# Patient Record
Sex: Female | Born: 1985 | Race: White | Hispanic: No | Smoking: Current every day smoker
Health system: Southern US, Community
[De-identification: ages and names within clinical notes are randomized; demographics above are authoritative.]

## PROBLEM LIST (undated history)

## (undated) DIAGNOSIS — C539 Malignant neoplasm of cervix uteri, unspecified: Secondary | ICD-10-CM

## (undated) DIAGNOSIS — R519 Headache, unspecified: Secondary | ICD-10-CM

## (undated) DIAGNOSIS — N390 Urinary tract infection, site not specified: Secondary | ICD-10-CM

## (undated) DIAGNOSIS — R51 Headache: Secondary | ICD-10-CM

## (undated) DIAGNOSIS — F419 Anxiety disorder, unspecified: Secondary | ICD-10-CM

## (undated) DIAGNOSIS — J40 Bronchitis, not specified as acute or chronic: Secondary | ICD-10-CM

## (undated) DIAGNOSIS — N200 Calculus of kidney: Secondary | ICD-10-CM

## (undated) DIAGNOSIS — N83209 Unspecified ovarian cyst, unspecified side: Secondary | ICD-10-CM

## (undated) DIAGNOSIS — K219 Gastro-esophageal reflux disease without esophagitis: Secondary | ICD-10-CM

## (undated) DIAGNOSIS — F329 Major depressive disorder, single episode, unspecified: Secondary | ICD-10-CM

## (undated) DIAGNOSIS — F32A Depression, unspecified: Secondary | ICD-10-CM

## (undated) HISTORY — PX: APPENDECTOMY: SHX54

## (undated) HISTORY — PX: CRYOTHERAPY: SHX1416

## (undated) HISTORY — PX: WISDOM TOOTH EXTRACTION: SHX21

## (undated) HISTORY — PX: DIAGNOSTIC LAPAROSCOPY: SUR761

---
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
## 2004-03-12 ENCOUNTER — Emergency Department (HOSPITAL_COMMUNITY): Admission: EM | Admit: 2004-03-12 | Discharge: 2004-03-12 | Payer: Self-pay | Admitting: Emergency Medicine

## 2005-08-27 ENCOUNTER — Emergency Department (HOSPITAL_COMMUNITY): Admission: EM | Admit: 2005-08-27 | Discharge: 2005-08-27 | Payer: Self-pay | Admitting: *Deleted

## 2007-05-20 ENCOUNTER — Emergency Department (HOSPITAL_COMMUNITY): Admission: EM | Admit: 2007-05-20 | Discharge: 2007-05-20 | Payer: Self-pay | Admitting: Emergency Medicine

## 2009-11-13 ENCOUNTER — Emergency Department (HOSPITAL_COMMUNITY): Admission: EM | Admit: 2009-11-13 | Discharge: 2009-11-13 | Payer: Self-pay | Admitting: Family Medicine

## 2009-12-04 ENCOUNTER — Emergency Department (HOSPITAL_COMMUNITY): Admission: EM | Admit: 2009-12-04 | Discharge: 2009-12-04 | Payer: Self-pay | Admitting: Emergency Medicine

## 2009-12-07 ENCOUNTER — Emergency Department (HOSPITAL_COMMUNITY): Admission: EM | Admit: 2009-12-07 | Discharge: 2009-12-07 | Payer: Self-pay | Admitting: Family Medicine

## 2010-05-31 LAB — POCT URINALYSIS DIPSTICK
Bilirubin Urine: NEGATIVE
Glucose, UA: NEGATIVE mg/dL
Ketones, ur: NEGATIVE mg/dL
Nitrite: NEGATIVE
Protein, ur: NEGATIVE mg/dL
Specific Gravity, Urine: 1.015 (ref 1.005–1.030)
Urobilinogen, UA: 1 mg/dL (ref 0.0–1.0)
pH: 7 (ref 5.0–8.0)

## 2010-05-31 LAB — POCT PREGNANCY, URINE: Preg Test, Ur: NEGATIVE

## 2010-06-11 ENCOUNTER — Emergency Department (HOSPITAL_COMMUNITY)
Admission: EM | Admit: 2010-06-11 | Discharge: 2010-06-11 | Disposition: A | Discharge status: Home / Self Care | Payer: Self-pay | Attending: Emergency Medicine | Admitting: Emergency Medicine

## 2010-06-11 DIAGNOSIS — M545 Low back pain, unspecified: Secondary | ICD-10-CM | POA: Insufficient documentation

## 2010-06-11 DIAGNOSIS — G8929 Other chronic pain: Secondary | ICD-10-CM | POA: Insufficient documentation

## 2010-08-03 NOTE — Op Note (Signed)
[facility]  Patient:    Ashley Francis                         MRN: [id number] Proc. Date: 03/01/99 Adm. Date:  [id number] Attending:  Pleas Koch CC:         [facility], Attn. Daylene Posey, FNP                           Operative Report  PREOPERATIVE DIAGNOSIS:       Right adnexal mass, intermittent pelvic pain.  POSTOPERATIVE DIAGNOSIS:      Right hydrosalpinx, absent right ovary.  OPERATION:                    Open laparoscopy with right partial salpingectomy and lysis of adhesions.  SURGEON:                      Georgina Peer, M.D.  ASSISTANT:                    Henreitta Leber, P.A.  ANESTHESIA:                   General anesthesia by Gretta Cool., M.D.  ESTIMATED BLOOD LOSS:         Less than 25 cc.  COMPLICATIONS:                None.  INDICATIONS:                  Right cystic adnexal mass on frozen section was a  right hydrosalpinx, absent right ovary.  Adhesions of the posterior uterus to the cul-de-sac.  Adhesions of the left tube to the descending colon.  A 25 year old, gravida 0, with history of ruptured appendix at age 40, with a cystic adnexal mass which was symptomatic and stable.  The patient was brought in for exploration and removal of this mass.  DESCRIPTION OF PROCEDURE:     The patient was taken to the operating room and given a general anesthesia, placed in the dorsal lithotomy position.  Abdomen, perineum, vagina, and urethra were prepped and draped sterilely.  A Foley catheter was placed in her bladder.  Bimanual examination revealed an anterior small mobile uterus ith a fullness in the right adnexa.  After adequate anesthesia, a vertical subumbilical incision was made.  The fascia was identified, transected, and 0 Vicryl sutures  were placed.  The peritoneum was identified and opened.  An open laparoscopic port was placed and a pneumoperitoneum established.  The laparoscope was  placed and he following pelvic findings were noted.  There appeared to be no injury from laparoscopic trocar placement nor any anterior abdominal wall adhesions.  The uterus was normal.  There was a cystic mass in the right adnexa between the uterus and the right pelvic side wall.  There was no clearly identifiable tube or ovary only this mass which had adhesions of the bowel to it.  The left ovary was freely mobile.  The left tube appeared normal, however, was adherent to the descending  colon.  The tube was posterior to the ovary and intringed in the colon.  While art of the fimbriated end was noted, it was uncertain whether the fimbriated end was free or healthy.  Using Harmonic scalpel and dissection, the cystic mass was dissected free and  removed.  There was good hemostasis and no injury to the colon or the pelvic side wall.  The ureter on the right side was clearly identified, separate and distant from this mass.  The mass was sent to pathology and frozen  section interpretation by Marcie Bal, M.D. was a hydrosalpinx.  Adhesions  were lysed between the uterus and the cul-de-sac.  There were no upper abdominal adhesions.  Photodocumentation was made and the ports removed.  10 cc of 0.25% Marcaine was drizzled over the operative site over where the tube was removed.  Also 10 cc of 0.25% Marcaine plain was injected into the laparoscopic port sites. The fascia was reapproximated with the Vicryl suture.  The skin was closed with  subcutaneous and sucuticular sutures of Dexon.  The vaginal instrument was removed. Sponge, needle, and instrument counts were correct.  The Foley catheter was removed and the patient was transferred to the recovery room in good condition. Sponge, needle, and instrument counts were correct. DD:  03/01/99 TD:  03/01/99 Job: [id number]

## 2010-12-10 LAB — URINALYSIS, ROUTINE W REFLEX MICROSCOPIC
Bilirubin Urine: NEGATIVE
Hgb urine dipstick: NEGATIVE
Protein, ur: NEGATIVE
Urobilinogen, UA: 0.2

## 2012-01-15 ENCOUNTER — Emergency Department (HOSPITAL_COMMUNITY)
Admission: EM | Admit: 2012-01-15 | Discharge: 2012-01-15 | Disposition: A | Discharge status: Home / Self Care | Payer: Medicaid Other | Source: Home / Self Care

## 2012-01-15 ENCOUNTER — Encounter (HOSPITAL_COMMUNITY): Payer: Self-pay | Admitting: *Deleted

## 2012-01-15 DIAGNOSIS — J329 Chronic sinusitis, unspecified: Secondary | ICD-10-CM

## 2012-01-15 MED ORDER — IBUPROFEN 800 MG PO TABS: 800.0000 mg | ORAL_TABLET | Freq: Three times a day (TID) | ORAL | Status: DC

## 2012-01-15 MED ORDER — AMOXICILLIN 500 MG PO CAPS: 500.0000 mg | ORAL_CAPSULE | Freq: Three times a day (TID) | ORAL | Status: DC

## 2012-01-15 NOTE — ED Provider Notes (Signed)
History     CSN: [id number]  Arrival date & time 01/15/12  1847   None     No chief complaint on file.   (Consider location/radiation/quality/duration/timing/severity/associated sxs/prior treatment) Patient is a 26 y.o. female presenting with headaches. The history is provided by the patient. No language interpreter was used.  Headache The primary symptoms include headaches. The symptoms began yesterday. The symptoms are worsening. Context: sinus congestion.  Pt complains of pressure in face.   Pt complains of a right earache.  Pt has a history of sinus infections.  Pt reports this feels the same  No past medical history on file.  No past surgical history on file.  No family history on file.  History  Substance Use Topics  . Smoking status: Not on file  . Smokeless tobacco: Not on file  . Alcohol Use: Not on file    OB History    No data available      Review of Systems  HENT: Positive for ear pain, congestion and sore throat.   Neurological: Positive for headaches.  All other systems reviewed and are negative.    Allergies  Review of patient's allergies indicates not on file.  Home Medications  No current outpatient prescriptions on file.  There were no vitals taken for this visit.  Physical Exam  Nursing note and vitals reviewed. Constitutional: She appears well-developed and well-nourished.  HENT:  Head: Normocephalic and atraumatic.  Right Ear: External ear normal.  Left Ear: External ear normal.  Mouth/Throat: Oropharynx is clear and moist.  Eyes: Conjunctivae normal and EOM are normal. Pupils are equal, round, and reactive to light.  Neck: Normal range of motion.  Cardiovascular: Normal rate and normal heart sounds.   Pulmonary/Chest: Effort normal.  Abdominal: Soft.  Musculoskeletal: Normal range of motion.  Neurological: She is alert.  Skin: Skin is warm.  Psychiatric: She has a normal mood and affect.    ED Course  Procedures (including  critical care time)  Labs Reviewed - No data to display No results found.   1. Sinusitis       MDM  amoxicillian and ibuprofen         Lonia Skinner [place], [place] 01/15/12 2044

## 2012-01-15 NOTE — ED Notes (Signed)
C/o feeling bad yesterday, sinus pain and pressure, headache and R earache. No chills but had sweating last night and this AM.  No sinus drainage or cough.

## 2012-01-16 NOTE — ED Provider Notes (Signed)
Medical screening examination/treatment/procedure(s) were performed by resident physician or non-physician practitioner and as supervising physician I was immediately available for consultation/collaboration.   Gweneth Fredlund DOUGLAS MD.    Mercy Malena D Vega Stare, MD 01/16/12 1857 

## 2012-04-09 ENCOUNTER — Emergency Department (HOSPITAL_COMMUNITY)
Admission: EM | Admit: 2012-04-09 | Discharge: 2012-04-09 | Disposition: A | Discharge status: Home / Self Care | Payer: Medicaid Other | Attending: Emergency Medicine | Admitting: Emergency Medicine

## 2012-04-09 ENCOUNTER — Emergency Department (HOSPITAL_COMMUNITY): Payer: Medicaid Other

## 2012-04-09 ENCOUNTER — Encounter (HOSPITAL_COMMUNITY): Payer: Self-pay | Admitting: *Deleted

## 2012-04-09 DIAGNOSIS — F17200 Nicotine dependence, unspecified, uncomplicated: Secondary | ICD-10-CM | POA: Insufficient documentation

## 2012-04-09 DIAGNOSIS — R071 Chest pain on breathing: Secondary | ICD-10-CM | POA: Insufficient documentation

## 2012-04-09 DIAGNOSIS — R0789 Other chest pain: Secondary | ICD-10-CM

## 2012-04-09 DIAGNOSIS — Z79899 Other long term (current) drug therapy: Secondary | ICD-10-CM | POA: Insufficient documentation

## 2012-04-09 LAB — BASIC METABOLIC PANEL
BUN: 8 mg/dL (ref 6–23)
CO2: 25 mEq/L (ref 19–32)
Calcium: 9.1 mg/dL (ref 8.4–10.5)
Creatinine, Ser: 0.73 mg/dL (ref 0.50–1.10)

## 2012-04-09 LAB — TROPONIN I: Troponin I: <0.3 ng/mL (ref <0.30)

## 2012-04-09 LAB — CBC
HCT: 40.6 % (ref 36.0–46.0)
MCH: 31.1 pg (ref 26.0–34.0)
MCV: 87.1 fL (ref 78.0–100.0)
Platelets: 305 10*3/uL (ref 150–400)
RBC: 4.66 MIL/uL (ref 3.87–5.11)
RDW: 12.3 % (ref 11.5–15.5)

## 2012-04-09 MED ORDER — ONDANSETRON HCL 4 MG/2ML IJ SOLN
4.0000 mg | Freq: Once | INTRAMUSCULAR | Status: AC
  Administered 2012-04-09: 4 mg via INTRAVENOUS
  Filled 2012-04-09: qty 2

## 2012-04-09 MED ORDER — ASPIRIN 325 MG PO TABS
325.0000 mg | ORAL_TABLET | Freq: Once | ORAL | Status: AC
  Administered 2012-04-09: 325 mg via ORAL
  Filled 2012-04-09: qty 1

## 2012-04-09 MED ORDER — MORPHINE SULFATE 4 MG/ML IJ SOLN
4.0000 mg | Freq: Once | INTRAMUSCULAR | Status: AC
  Administered 2012-04-09: 4 mg via INTRAVENOUS
  Filled 2012-04-09: qty 1

## 2012-04-09 MED ORDER — OXYCODONE-ACETAMINOPHEN 5-325 MG PO TABS
1.0000 | ORAL_TABLET | Freq: Once | ORAL | Status: AC
  Administered 2012-04-09: 1 via ORAL
  Filled 2012-04-09: qty 1

## 2012-04-09 MED ORDER — IBUPROFEN 600 MG PO TABS: 600.0000 mg | ORAL_TABLET | Freq: Four times a day (QID) | ORAL | Status: DC | PRN

## 2012-04-09 NOTE — ED Notes (Signed)
Patient states chest pain x 1 day, patient states left sided chest pain radiating to left shoulder, patient states she took xanax type medicine last night, went to sleep and then awoke with pain this am

## 2012-04-09 NOTE — ED Provider Notes (Signed)
History     CSN: [id number]  Arrival date & time 04/09/12  0705   First MD Initiated Contact with Patient 04/09/12 [phone]      Chief Complaint  Patient presents with  . Chest Pain    (Consider location/radiation/quality/duration/timing/severity/associated sxs/prior treatment) HPI Pt presents with c/o chest pain which has been present constantly over the past 24 hours.  She states pain is located under her left breast, is sharp in nature and is causing her to have difficulty taking deep breaths as this makes the pain worse.  No cough or fever.  No leg swelling.  Pain is worse with movement and deep breathing.  She took a xanax last night which seemed to help, but then was woken up by the pain this morning.  No recent travel/surgery/trauma.  No hx of DVT/PE.  Pain is not associated with exertion.  There are no other associated systemic symptoms, there are no other alleviating or modifying factors.   History reviewed. No pertinent past medical history.  History reviewed. No pertinent past surgical history.  No family history on file.  History  Substance Use Topics  . Smoking status: Current Every Day Smoker -- 0.1 packs/day    Types: Cigarettes  . Smokeless tobacco: Not on file  . Alcohol Use: Yes     Comment: occasional    OB History    Grav Para Term Preterm Abortions TAB SAB Ect Mult Living                  Review of Systems ROS reviewed and all otherwise negative except for mentioned in HPI  Allergies  Review of patient's allergies indicates no known allergies.  Home Medications   Current Outpatient Rx  Name  Route  Sig  Dispense  Refill  . IBUPROFEN 800 MG PO TABS   Oral   Take 800 mg by mouth every 8 (eight) hours as needed. For pain         . NORETHINDRONE-ETH ESTRADIOL 1-35 MG-MCG PO TABS   Oral   Take 1 tablet by mouth daily.         . IBUPROFEN 600 MG PO TABS   Oral   Take 1 tablet (600 mg total) by mouth every 6 (six) hours as needed for pain.  30 tablet   0     BP 137/77  Temp 98.1 F (36.7 C) (Oral)  Resp 18  SpO2 100%  LMP 03/11/2012 Vitals reviewed Physical Exam Physical Examination: General appearance - alert, well appearing, and in no distress Mental status - alert, oriented to person, place, and time Eyes - no scleral icterus, no conjunctival injection Mouth - mucous membranes moist, pharynx normal without lesions Chest - clear to auscultation, no wheezes, rales or rhonchi, symmetric air entry, mild ttp over left lower chest wall under breast Heart - normal rate, regular rhythm, normal S1, S2, no murmurs, rubs, clicks or gallops Abdomen - soft, nontender, nondistended, no masses or organomegaly Extremities - peripheral pulses normal, no pedal edema, no clubbing or cyanosis Skin - normal coloration and turgor, no rashes  ED Course  Procedures (including critical care time)   Date: 04/09/2012  Rate: 80  Rhythm: normal sinus rhythm  QRS Axis: normal  Intervals: normal  ST/T Wave abnormalities: normal  Conduction Disutrbances: none  Narrative Interpretation: unremarkable, no prior ekg      Labs Reviewed  BASIC METABOLIC PANEL - Abnormal; Notable for the following:    Glucose, Bld 101 (*)  All other components within normal limits  CBC  TROPONIN I  D-DIMER, QUANTITATIVE   Dg Chest 2 View  04/09/2012  *RADIOLOGY REPORT*  Clinical Data: Left chest pain  CHEST - 2 VIEW  Comparison: None  Findings: Upper-normal size of cardiac silhouette. Mediastinal contours and pulmonary vascularity normal. Lungs clear. No pleural effusion or pneumothorax. Bones unremarkable.  IMPRESSION: No acute abnormalities.   Original Report Authenticated By: Ulyses Southward, M.D.      1. Chest wall pain       MDM  Pt presenting with c/o left sided chest pain.  Low suspicion for ACS given normal EKG and negative troponin after 24 hours of constant pain.  D-dimer obtained due to hx of OCP use- this was also negative.  CXR negative  as well.  Pt's pain improved after pain meds in the ED.  Discharged with strict return precautions.  Pt agreeable with plan.        Ethelda Chick, MD 04/09/12 [phone]

## 2013-01-06 ENCOUNTER — Emergency Department (INDEPENDENT_AMBULATORY_CARE_PROVIDER_SITE_OTHER)
Admission: EM | Admit: 2013-01-06 | Discharge: 2013-01-06 | Disposition: A | Discharge status: Home / Self Care | Payer: Medicaid Other | Source: Home / Self Care

## 2013-01-06 ENCOUNTER — Encounter (HOSPITAL_COMMUNITY): Payer: Self-pay | Admitting: Emergency Medicine

## 2013-01-06 DIAGNOSIS — L2089 Other atopic dermatitis: Secondary | ICD-10-CM

## 2013-01-06 DIAGNOSIS — L209 Atopic dermatitis, unspecified: Secondary | ICD-10-CM

## 2013-01-06 MED ORDER — PREDNISONE 20 MG PO TABS: ORAL_TABLET | ORAL | Status: DC

## 2013-01-06 MED ORDER — DIPHENHYDRAMINE HCL 25 MG PO TABS: 12.5000 mg | ORAL_TABLET | Freq: Four times a day (QID) | ORAL | Status: DC | PRN

## 2013-01-06 NOTE — ED Provider Notes (Signed)
CSN: [id number]     Arrival date & time 01/06/13  1604 History   None    Chief Complaint  Patient presents with  . Rash   HPI 27 year old Caucasian female, recently had clindamycin antibiotic prophylaxis for root canal surgery, prescribed this 10/7, received at 10/9, to get to 10/12 and then developed final fascicular rash over chest back and face. Denies any shortness of breath, difficulty breathing, diarrhea, swelling of the mouth or tongue States that she has never had this type of reaction before    History reviewed. No pertinent past medical history. History reviewed. No pertinent past surgical history. History reviewed. No pertinent family history. History  Substance Use Topics  . Smoking status: Current Every Day Smoker -- 0.10 packs/day    Types: Cigarettes  . Smokeless tobacco: Not on file  . Alcohol Use: Yes     Comment: occasional   OB History   Grav Para Term Preterm Abortions TAB SAB Ect Mult Living                 Review of Systems No blurred vision or double vision no chest pain or shortness of breath No diarrhea no constipation For fever no chills No bites  Allergies  Clindamycin/lincomycin  Home Medications   Current Outpatient Rx  Name  Route  Sig  Dispense  Refill  . ibuprofen (ADVIL,MOTRIN) 600 MG tablet   Oral   Take 1 tablet (600 mg total) by mouth every 6 (six) hours as needed for pain.   30 tablet   0   . ibuprofen (ADVIL,MOTRIN) 800 MG tablet   Oral   Take 800 mg by mouth every 8 (eight) hours as needed. For pain         . norethindrone-ethinyl estradiol 1/35 (ORTHO-NOVUM, NORTREL,CYCLAFEM) tablet   Oral   Take 1 tablet by mouth daily.          BP 135/88  Pulse 81  Temp(Src) 98.1 F (36.7 C) (Oral)  Resp 20  SpO2 97%  LMP 12/30/2012 Physical Exam Slightly obese pleasant Caucasian female no apparent distress Final fascicular papular rash over back face and chest Multiple tattoos S1-S2 no murmur rub or gallop Clinically  clear  ED Course  Procedures (including critical care time) Labs Review Labs Reviewed - No data to display Imaging Review No results found.  EKG Interpretation     Ventricular Rate:    PR Interval:    QRS Duration:   QT Interval:    QTC Calculation:   R Axis:     Text Interpretation:              MDM  No diagnosis found. Likely drug-related reaction Discussed options alternatives with patient-given widespread extent, favor prednisone 40-60 mg 2-3 days, Atarax low-dose/Benadryl Will also prescribe if needed some Eucerin ointment [can get over-the-counter] Advised avoidance of warm showers and the usual expected management of allergic diathesis Patient followup at [facility] physician as needed Have advised her to discontinue clindamycin completely until she discusses the same with dental right      Rhetta Mura, MD 01/06/13 1709

## 2013-01-06 NOTE — ED Notes (Signed)
Pt  Has     Symptoms  Of  A  Rash    That    Started  Three  Days  After  Beginning  Clindamycin         From  Dental  Work  She  Has  Stopped  The  clindamyin    4  Days  Ago and  Still  Has  The  Fine  Rash   She is  In no  Acute  Distress  No  Angioedema

## 2013-03-20 ENCOUNTER — Emergency Department (HOSPITAL_COMMUNITY)
Admission: EM | Admit: 2013-03-20 | Discharge: 2013-03-20 | Disposition: A | Discharge status: Home / Self Care | Payer: Medicaid Other | Source: Home / Self Care | Attending: Family Medicine | Admitting: Family Medicine

## 2013-03-20 ENCOUNTER — Encounter (HOSPITAL_COMMUNITY): Payer: Self-pay | Admitting: Emergency Medicine

## 2013-03-20 DIAGNOSIS — L0291 Cutaneous abscess, unspecified: Secondary | ICD-10-CM

## 2013-03-20 DIAGNOSIS — L0390 Cellulitis, unspecified: Principal | ICD-10-CM

## 2013-03-20 MED ORDER — ONDANSETRON 4 MG PO TBDP
8.0000 mg | ORAL_TABLET | Freq: Once | ORAL | Status: AC
  Administered 2013-03-20: 8 mg via ORAL

## 2013-03-20 MED ORDER — ONDANSETRON 4 MG PO TBDP
ORAL_TABLET | ORAL | Status: AC
  Filled 2013-03-20: qty 2

## 2013-03-20 MED ORDER — DOXYCYCLINE HYCLATE 100 MG PO CAPS: 100.0000 mg | ORAL_CAPSULE | Freq: Two times a day (BID) | ORAL | Status: DC

## 2013-03-20 NOTE — ED Notes (Signed)
C/o boil/abscess on inner left thigh States it has been there for two days  States area is red and sore Has used vinegar, hot water treatment and ibuprofen but no relief.

## 2013-03-20 NOTE — Discharge Instructions (Signed)
Thank you for coming in today. Take doxycycline twice daily for 10 days. Remove the packing material in 2-3 days. It is okay follows up sooner.  Change dressing when it becomes dirty  Abscess Care After An abscess (also called a boil or furuncle) is an infected area that contains a collection of pus. Signs and symptoms of an abscess include pain, tenderness, redness, or hardness, or you may feel a moveable soft area under your skin. An abscess can occur anywhere in the body. The infection may spread to surrounding tissues causing cellulitis. A cut (incision) by the surgeon was made over your abscess and the pus was drained out. Gauze may have been packed into the space to provide a drain that will allow the cavity to heal from the inside outwards. The boil may be painful for 5 to 7 days. Most people with a boil do not have high fevers. Your abscess, if seen early, may not have localized, and may not have been lanced. If not, another appointment may be required for this if it does not get better on its own or with medications. HOME CARE INSTRUCTIONS   Only take over-the-counter or prescription medicines for pain, discomfort, or fever as directed by your caregiver.  When you bathe, soak and then remove gauze or iodoform packs at least daily or as directed by your caregiver. You may then wash the wound gently with mild soapy water. Repack with gauze or do as your caregiver directs. SEEK IMMEDIATE MEDICAL CARE IF:   You develop increased pain, swelling, redness, drainage, or bleeding in the wound site.  You develop signs of generalized infection including muscle aches, chills, fever, or a general ill feeling.  An oral temperature above 102 F (38.9 C) develops, not controlled by medication. See your caregiver for a recheck if you develop any of the symptoms described above. If medications (antibiotics) were prescribed, take them as directed. Document Released: 09/20/2004 Document Revised:  05/27/2011 Document Reviewed: 05/18/2007 [facility] Patient Information 2014 [place].

## 2013-03-20 NOTE — ED Provider Notes (Signed)
Ashley Francis is a 28 y.o. female who presents to Urgent Care today for left groin abscess present for 2 days. Patient has tenderness and surrounding erythema and induration. She denies any injury. She has tried warm compress and vinegar which have not helped. No fevers chills nausea vomiting or diarrhea. She has never had a similar abscess like this.   History reviewed. No pertinent past medical history. History  Substance Use Topics  . Smoking status: Current Every Day Smoker -- 0.10 packs/day    Types: Cigarettes  . Smokeless tobacco: Not on file  . Alcohol Use: Yes     Comment: occasional   ROS as above Medications reviewed. No current facility-administered medications for this encounter.   Current Outpatient Prescriptions  Medication Sig Dispense Refill  . doxycycline (VIBRAMYCIN) 100 MG capsule Take 1 capsule (100 mg total) by mouth 2 (two) times daily.  20 capsule  0  . norethindrone-ethinyl estradiol 1/35 (ORTHO-NOVUM, NORTREL,CYCLAFEM) tablet Take 1 tablet by mouth daily.      . [DISCONTINUED] diphenhydrAMINE (BENADRYL) 25 MG tablet Take 0.5 tablets (12.5 mg total) by mouth every 6 (six) hours as needed for itching (Take half tablet every 4-6 hours for itching.-This can cause some sedation, exercise caution with machinery and activity).  30 tablet  0    Exam:  BP 118/70  Pulse 97  Temp(Src) 98.2 F (36.8 C) (Oral)  Resp 18  SpO2 96%  LMP 02/25/2013 Gen: Well NAD SKIN: Small fluctuant central abscess left upper inner thigh. Large 4 cm in diameter area of erythema and induration that and tenderness.   INCISION AND DRAINAGE Performed by: Lynne Leader, S Consent: Verbal consent obtained. Risks and benefits: risks, benefits and alternatives were discussed Type: abscess  Body area: Left upper inner thigh  Anesthesia: local infiltration  Incision was made with a scalpel.  Local anesthetic: lidocaine 2% with epinephrine  Anesthetic total: 2 ml  Complexity:  Simple Blunt dissection to break up loculations  Drainage: purulent  Drainage amount: 33ml. culture pending   Packing material: 1/4 in iodoform gauze. 3 cm  Patient tolerance: Patient tolerated the procedure well with no immediate complications.  Patient had some mild nausea following the procedure. Her heart rate was steady and regular normal sounding. She denies any dizziness or lightheadedness. She was given 8 mg of Zofran by mouth prior to discharge and felt well,    Assessment and Plan: 28 y.o. female with abscess and cellulitis of the left upper inner thigh. Culture is pending. Plan to treat empirically with oral doxycycline. Follow up with primary care provider. Remove packing in 2-3 days. Discussed warning signs or symptoms. Please see discharge instructions. Patient expresses understanding.     Gregor Hams, MD 03/20/13 [phone]

## 2013-03-24 LAB — CULTURE, ROUTINE-ABSCESS

## 2013-03-24 NOTE — ED Notes (Signed)
Abscess culture L thigh: few Diphtheroids (Corynebacterium species).  Pt. treated with I and D and Doxycycline.  Message sent to Dr. Georgina Snell. Roselyn Meier 03/24/2013

## 2013-03-30 ENCOUNTER — Telehealth (HOSPITAL_COMMUNITY): Payer: Self-pay | Admitting: *Deleted

## 2013-03-30 NOTE — ED Notes (Signed)
Pt. called back.  Pt. verified x 2 and given results.  No drainage but still has a little redness.  Instructed to finish all of antibiotics and come back if not better.

## 2013-04-17 ENCOUNTER — Encounter (HOSPITAL_COMMUNITY): Payer: Self-pay | Admitting: Emergency Medicine

## 2013-04-17 ENCOUNTER — Emergency Department (HOSPITAL_COMMUNITY)
Admission: EM | Admit: 2013-04-17 | Discharge: 2013-04-17 | Disposition: A | Discharge status: Home / Self Care | Payer: Medicaid Other | Attending: Emergency Medicine | Admitting: Emergency Medicine

## 2013-04-17 ENCOUNTER — Emergency Department (HOSPITAL_COMMUNITY): Payer: Medicaid Other

## 2013-04-17 DIAGNOSIS — Z87440 Personal history of urinary (tract) infections: Secondary | ICD-10-CM | POA: Insufficient documentation

## 2013-04-17 DIAGNOSIS — Z79899 Other long term (current) drug therapy: Secondary | ICD-10-CM | POA: Insufficient documentation

## 2013-04-17 DIAGNOSIS — F17200 Nicotine dependence, unspecified, uncomplicated: Secondary | ICD-10-CM | POA: Insufficient documentation

## 2013-04-17 DIAGNOSIS — R110 Nausea: Secondary | ICD-10-CM | POA: Insufficient documentation

## 2013-04-17 DIAGNOSIS — N83209 Unspecified ovarian cyst, unspecified side: Secondary | ICD-10-CM | POA: Insufficient documentation

## 2013-04-17 DIAGNOSIS — N83202 Unspecified ovarian cyst, left side: Secondary | ICD-10-CM

## 2013-04-17 DIAGNOSIS — Z3202 Encounter for pregnancy test, result negative: Secondary | ICD-10-CM | POA: Insufficient documentation

## 2013-04-17 HISTORY — DX: Urinary tract infection, site not specified: N39.0

## 2013-04-17 LAB — URINE MICROSCOPIC-ADD ON

## 2013-04-17 LAB — CBC WITH DIFFERENTIAL/PLATELET
Basophils Absolute: 0 10*3/uL (ref 0.0–0.1)
Basophils Relative: 0 % (ref 0–1)
EOS PCT: 3 % (ref 0–5)
Eosinophils Absolute: 0.2 10*3/uL (ref 0.0–0.7)
HEMATOCRIT: 39.9 % (ref 36.0–46.0)
HEMOGLOBIN: 14.5 g/dL (ref 12.0–15.0)
LYMPHS ABS: 2.3 10*3/uL (ref 0.7–4.0)
LYMPHS PCT: 33 % (ref 12–46)
MCH: 31.7 pg (ref 26.0–34.0)
MCHC: 36.3 g/dL — ABNORMAL HIGH (ref 30.0–36.0)
MCV: 87.3 fL (ref 78.0–100.0)
MONO ABS: 0.5 10*3/uL (ref 0.1–1.0)
MONOS PCT: 8 % (ref 3–12)
Neutro Abs: 3.9 10*3/uL (ref 1.7–7.7)
Neutrophils Relative %: 57 % (ref 43–77)
Platelets: 268 10*3/uL (ref 150–400)
RBC: 4.57 MIL/uL (ref 3.87–5.11)
RDW: 12.3 % (ref 11.5–15.5)
WBC: 6.9 10*3/uL (ref 4.0–10.5)

## 2013-04-17 LAB — BASIC METABOLIC PANEL
BUN: 7 mg/dL (ref 6–23)
CALCIUM: 8.7 mg/dL (ref 8.4–10.5)
CO2: 21 meq/L (ref 19–32)
CREATININE: 0.6 mg/dL (ref 0.50–1.10)
Chloride: 102 mEq/L (ref 96–112)
GFR calc Af Amer: >90 mL/min (ref >90)
GFR calc non Af Amer: >90 mL/min (ref >90)
GLUCOSE: 105 mg/dL — AB (ref 70–99)
Potassium: 4.1 mEq/L (ref 3.7–5.3)
SODIUM: 137 meq/L (ref 137–147)

## 2013-04-17 LAB — URINALYSIS, ROUTINE W REFLEX MICROSCOPIC
BILIRUBIN URINE: NEGATIVE
Glucose, UA: NEGATIVE mg/dL
KETONES UR: NEGATIVE mg/dL
Leukocytes, UA: NEGATIVE
Nitrite: NEGATIVE
PH: 6 (ref 5.0–8.0)
Protein, ur: NEGATIVE mg/dL
SPECIFIC GRAVITY, URINE: 1.021 (ref 1.005–1.030)
Urobilinogen, UA: 0.2 mg/dL (ref 0.0–1.0)

## 2013-04-17 LAB — PREGNANCY, URINE: Preg Test, Ur: NEGATIVE

## 2013-04-17 MED ORDER — PROMETHAZINE HCL 25 MG PO TABS: 25.0000 mg | ORAL_TABLET | Freq: Four times a day (QID) | ORAL | Status: DC | PRN

## 2013-04-17 MED ORDER — DIPHENHYDRAMINE HCL 50 MG/ML IJ SOLN
25.0000 mg | Freq: Once | INTRAMUSCULAR | Status: AC
  Administered 2013-04-17: 25 mg via INTRAVENOUS
  Filled 2013-04-17: qty 1

## 2013-04-17 MED ORDER — ONDANSETRON HCL 4 MG/2ML IJ SOLN
4.0000 mg | Freq: Once | INTRAMUSCULAR | Status: AC
  Administered 2013-04-17: 4 mg via INTRAVENOUS
  Filled 2013-04-17: qty 2

## 2013-04-17 MED ORDER — HYDROMORPHONE HCL PF 1 MG/ML IJ SOLN
1.0000 mg | Freq: Once | INTRAMUSCULAR | Status: AC
  Administered 2013-04-17: 1 mg via INTRAVENOUS
  Filled 2013-04-17: qty 1

## 2013-04-17 MED ORDER — KETOROLAC TROMETHAMINE 30 MG/ML IJ SOLN
30.0000 mg | Freq: Once | INTRAMUSCULAR | Status: AC
  Administered 2013-04-17: 30 mg via INTRAVENOUS
  Filled 2013-04-17: qty 1

## 2013-04-17 MED ORDER — HYDROMORPHONE HCL PF 1 MG/ML IJ SOLN
1.0000 mg | Freq: Once | INTRAMUSCULAR | Status: AC
Start: 2013-04-17 — End: 2013-04-17
  Administered 2013-04-17: 1 mg via INTRAVENOUS
  Filled 2013-04-17: qty 1

## 2013-04-17 MED ORDER — OXYCODONE-ACETAMINOPHEN 5-325 MG PO TABS: 1.0000 | ORAL_TABLET | ORAL | Status: DC | PRN

## 2013-04-17 NOTE — ED Notes (Signed)
Pt c/o left flank pain with nausea onset for past 4 days increased today. Pt had mirana replaced recently and is concerned that is the cause. Pt has tried naproxen without relief.

## 2013-04-17 NOTE — ED Provider Notes (Signed)
CSN: [id number]     Arrival date & time 04/17/13  1703 History   First MD Initiated Contact with Patient 04/17/13 1731     Chief Complaint  Patient presents with  . Flank Pain    left side  . Nausea    The history is provided by the patient.   is reports worsening left flank pain as well as left lower quadrant abdominal pain of the past several days.  At times becomes worse and become sharp.  Nausea without vomiting.  No diarrhea.  No history kidney stones.  States her pain is severe.  No dysuria urinary frequency.  She does state that she had a urinary tract infection several weeks ago.  Past Medical History  Diagnosis Date  . UTI (lower urinary tract infection)    History reviewed. No pertinent past surgical history. No family history on file. History  Substance Use Topics  . Smoking status: Current Every Day Smoker -- 0.10 packs/day    Types: Cigarettes  . Smokeless tobacco: Not on file  . Alcohol Use: Yes     Comment: occasional   OB History   Grav Para Term Preterm Abortions TAB SAB Ect Mult Living                 Review of Systems  Genitourinary: Positive for flank pain.  All other systems reviewed and are negative.    Allergies  Clindamycin/lincomycin  Home Medications   Current Outpatient Rx  Name  Route  Sig  Dispense  Refill  . naproxen (NAPROSYN) 500 MG tablet   Oral   Take 500 mg by mouth daily as needed (pain).         . Omega-3 Fatty Acids (FISH OIL) 1000 MG CAPS   Oral   Take 3,000 mg by mouth daily.         Marland Kitchen oxyCODONE-acetaminophen (PERCOCET/ROXICET) 5-325 MG per tablet   Oral   Take 1 tablet by mouth every 4 (four) hours as needed for severe pain.   20 tablet   0   . promethazine (PHENERGAN) 25 MG tablet   Oral   Take 1 tablet (25 mg total) by mouth every 6 (six) hours as needed for nausea or vomiting.   15 tablet   0    BP 143/89  Pulse 60  Temp(Src) 98.1 F (36.7 C) (Oral)  Resp 18  Ht 5\' 4"  (1.626 m)  Wt 205 lb (92.987  kg)  BMI 35.17 kg/m2  SpO2 100%  LMP 03/28/2013 Physical Exam  Nursing note and vitals reviewed. Constitutional: She is oriented to person, place, and time. She appears well-developed and well-nourished. No distress.  Uncomfortable appearing  HENT:  Head: Normocephalic and atraumatic.  Eyes: EOM are normal.  Neck: Normal range of motion.  Cardiovascular: Normal rate, regular rhythm and normal heart sounds.   Pulmonary/Chest: Effort normal and breath sounds normal.  Abdominal: Soft. She exhibits no distension. There is no tenderness.  Musculoskeletal: Normal range of motion.  Neurological: She is alert and oriented to person, place, and time.  Skin: Skin is warm and dry.  Psychiatric: She has a normal mood and affect. Judgment normal.    ED Course  Procedures (including critical care time) Labs Review Labs Reviewed  URINALYSIS, ROUTINE W REFLEX MICROSCOPIC - Abnormal; Notable for the following:    APPearance CLOUDY (*)    Hgb urine dipstick TRACE (*)    All other components within normal limits  CBC WITH DIFFERENTIAL - Abnormal; Notable  for the following:    MCHC 36.3 (*)    All other components within normal limits  BASIC METABOLIC PANEL - Abnormal; Notable for the following:    Glucose, Bld 105 (*)    All other components within normal limits  URINE MICROSCOPIC-ADD ON - Abnormal; Notable for the following:    Squamous Epithelial / LPF FEW (*)    Bacteria, UA FEW (*)    All other components within normal limits  PREGNANCY, URINE   Imaging Review Ct Abdomen Pelvis Wo Contrast  04/17/2013   CLINICAL DATA:  Left-sided flank pain.  Nausea.  EXAM: CT ABDOMEN AND PELVIS WITHOUT CONTRAST  TECHNIQUE: Multidetector CT imaging of the abdomen and pelvis was performed following the standard protocol without intravenous contrast.  COMPARISON:  None.  FINDINGS: Lung bases are clear.  No pericardial fluid.  No focal hepatic lesion on this noncontrast exam. The gallbladder, pancreas,  spleen, adrenal glands, and kidneys are normal.  The stomach, small bowel, and cecum are normal. Colon rectosigmoid colon are normal.  Abdominal aorta is normal caliber. No retroperitoneal periportal lymphadenopathy.  No free fluid the pelvis. No distal ureteral stones or bladder stones. IUD expected location in uterus. The left ovary is prominent at at 42 by 29 mm. Right ovary is not identified. No free fluid the pelvis. No pelvic lymphadenopathy. No aggressive osseous lesion.  IMPRESSION: 1. No nephrolithiasis or ureterolithiasis. 2. No explanation for left-sided pain other than a prominent left ovary. 3. IUD in appropriate position in the uterus.   Electronically Signed   By: Suzy Bouchard M.D.   On: 04/17/2013 19:37   US Transvaginal Non-ob  04/17/2013   CLINICAL DATA:  Right oophorectomy, left flank pain  EXAM: TRANSABDOMINAL AND TRANSVAGINAL ULTRASOUND OF PELVIS  DOPPLER ULTRASOUND OF OVARIES  TECHNIQUE: Both transabdominal and transvaginal ultrasound examinations of the pelvis were performed. Transabdominal technique was performed for global imaging of the pelvis including uterus, ovaries, adnexal regions, and pelvic cul-de-sac.  It was necessary to proceed with endovaginal exam following the transabdominal exam to visualize the adnexa and left ovary. Color and duplex Doppler ultrasound was utilized to evaluate blood flow to the ovaries.  COMPARISON:  None.  FINDINGS: Uterus  Measurements: 7.9 x 3.4 x 5.1 cm. No fibroids or other mass visualized. There is an intrauterine device in satisfactory position.  Endometrium  Thickness: 5.8 mm.  No focal abnormality visualized.  Right ovary  Prior oophorectomy.  Left ovary  Measurements: 5.7 x 3 x 3 cm. Multiple ovarian follicles noted. Normal appearance/no adnexal mass.  Pulsed Doppler evaluation of the left ovary demonstrates normal low-resistance arterial and venous waveforms.  Other findings  There is a small amount of pelvic free fluid likely physiologic.   IMPRESSION: 1. No left ovarian torsion. 2. Intrauterine device in satisfactory position.   Electronically Signed   By: Kathreen Devoid   On: 04/17/2013 20:52   US Pelvis Complete  04/17/2013   CLINICAL DATA:  Right oophorectomy, left flank pain  EXAM: TRANSABDOMINAL AND TRANSVAGINAL ULTRASOUND OF PELVIS  DOPPLER ULTRASOUND OF OVARIES  TECHNIQUE: Both transabdominal and transvaginal ultrasound examinations of the pelvis were performed. Transabdominal technique was performed for global imaging of the pelvis including uterus, ovaries, adnexal regions, and pelvic cul-de-sac.  It was necessary to proceed with endovaginal exam following the transabdominal exam to visualize the adnexa and left ovary. Color and duplex Doppler ultrasound was utilized to evaluate blood flow to the ovaries.  COMPARISON:  None.  FINDINGS: Uterus  Measurements:  7.9 x 3.4 x 5.1 cm. No fibroids or other mass visualized. There is an intrauterine device in satisfactory position.  Endometrium  Thickness: 5.8 mm.  No focal abnormality visualized.  Right ovary  Prior oophorectomy.  Left ovary  Measurements: 5.7 x 3 x 3 cm. Multiple ovarian follicles noted. Normal appearance/no adnexal mass.  Pulsed Doppler evaluation of the left ovary demonstrates normal low-resistance arterial and venous waveforms.  Other findings  There is a small amount of pelvic free fluid likely physiologic.  IMPRESSION: 1. No left ovarian torsion. 2. Intrauterine device in satisfactory position.   Electronically Signed   By: Kathreen Devoid   On: 04/17/2013 20:52   [place] Art/ven Flow Abd Pelv Doppler  04/17/2013   CLINICAL DATA:  Right oophorectomy, left flank pain  EXAM: TRANSABDOMINAL AND TRANSVAGINAL ULTRASOUND OF PELVIS  DOPPLER ULTRASOUND OF OVARIES  TECHNIQUE: Both transabdominal and transvaginal ultrasound examinations of the pelvis were performed. Transabdominal technique was performed for global imaging of the pelvis including uterus, ovaries, adnexal regions, and pelvic  cul-de-sac.  It was necessary to proceed with endovaginal exam following the transabdominal exam to visualize the adnexa and left ovary. Color and duplex Doppler ultrasound was utilized to evaluate blood flow to the ovaries.  COMPARISON:  None.  FINDINGS: Uterus  Measurements: 7.9 x 3.4 x 5.1 cm. No fibroids or other mass visualized. There is an intrauterine device in satisfactory position.  Endometrium  Thickness: 5.8 mm.  No focal abnormality visualized.  Right ovary  Prior oophorectomy.  Left ovary  Measurements: 5.7 x 3 x 3 cm. Multiple ovarian follicles noted. Normal appearance/no adnexal mass.  Pulsed Doppler evaluation of the left ovary demonstrates normal low-resistance arterial and venous waveforms.  Other findings  There is a small amount of pelvic free fluid likely physiologic.  IMPRESSION: 1. No left ovarian torsion. 2. Intrauterine device in satisfactory position.   Electronically Signed   By: Kathreen Devoid   On: 04/17/2013 20:52  I personally reviewed the imaging tests through PACS system I reviewed available ER/hospitalization records through the EMR   EKG Interpretation   None       MDM   1. Left ovarian cyst    Suspect left renal colic.  CT scan pending.  Pain being treated.  10:16 PM Patient feels much better.  OB/GYN followup.  Pain control.  Discharge home with pain medication.  Suspect left ovarian cyst    Hoy Morn, MD 04/17/13 2216

## 2013-05-14 ENCOUNTER — Emergency Department (HOSPITAL_COMMUNITY): Payer: Medicaid Other

## 2013-05-14 ENCOUNTER — Encounter (HOSPITAL_COMMUNITY): Payer: Self-pay | Admitting: Emergency Medicine

## 2013-05-14 ENCOUNTER — Emergency Department (HOSPITAL_COMMUNITY)
Admission: EM | Admit: 2013-05-14 | Discharge: 2013-05-14 | Disposition: A | Discharge status: Home / Self Care | Payer: Medicaid Other | Attending: Emergency Medicine | Admitting: Emergency Medicine

## 2013-05-14 DIAGNOSIS — N898 Other specified noninflammatory disorders of vagina: Secondary | ICD-10-CM | POA: Insufficient documentation

## 2013-05-14 DIAGNOSIS — IMO0001 Reserved for inherently not codable concepts without codable children: Secondary | ICD-10-CM | POA: Insufficient documentation

## 2013-05-14 DIAGNOSIS — Z881 Allergy status to other antibiotic agents status: Secondary | ICD-10-CM | POA: Insufficient documentation

## 2013-05-14 DIAGNOSIS — N83209 Unspecified ovarian cyst, unspecified side: Secondary | ICD-10-CM | POA: Insufficient documentation

## 2013-05-14 DIAGNOSIS — Z87440 Personal history of urinary (tract) infections: Secondary | ICD-10-CM | POA: Insufficient documentation

## 2013-05-14 DIAGNOSIS — Z79899 Other long term (current) drug therapy: Secondary | ICD-10-CM | POA: Insufficient documentation

## 2013-05-14 DIAGNOSIS — R110 Nausea: Secondary | ICD-10-CM

## 2013-05-14 DIAGNOSIS — F17200 Nicotine dependence, unspecified, uncomplicated: Secondary | ICD-10-CM | POA: Insufficient documentation

## 2013-05-14 DIAGNOSIS — M545 Low back pain, unspecified: Secondary | ICD-10-CM

## 2013-05-14 DIAGNOSIS — R112 Nausea with vomiting, unspecified: Secondary | ICD-10-CM | POA: Insufficient documentation

## 2013-05-14 DIAGNOSIS — Z87442 Personal history of urinary calculi: Secondary | ICD-10-CM | POA: Insufficient documentation

## 2013-05-14 HISTORY — DX: Unspecified ovarian cyst, unspecified side: N83.209

## 2013-05-14 HISTORY — DX: Calculus of kidney: N20.0

## 2013-05-14 LAB — URINALYSIS, ROUTINE W REFLEX MICROSCOPIC
Bilirubin Urine: NEGATIVE
GLUCOSE, UA: NEGATIVE mg/dL
Hgb urine dipstick: NEGATIVE
Ketones, ur: NEGATIVE mg/dL
LEUKOCYTES UA: NEGATIVE
NITRITE: NEGATIVE
PH: 5.5 (ref 5.0–8.0)
Protein, ur: NEGATIVE mg/dL
Specific Gravity, Urine: 1.023 (ref 1.005–1.030)
Urobilinogen, UA: 0.2 mg/dL (ref 0.0–1.0)

## 2013-05-14 LAB — CBC WITH DIFFERENTIAL/PLATELET
BASOS PCT: 0 % (ref 0–1)
Basophils Absolute: 0 10*3/uL (ref 0.0–0.1)
EOS ABS: 0.2 10*3/uL (ref 0.0–0.7)
Eosinophils Relative: 2 % (ref 0–5)
HCT: 40.4 % (ref 36.0–46.0)
Hemoglobin: 14.2 g/dL (ref 12.0–15.0)
Lymphocytes Relative: 25 % (ref 12–46)
Lymphs Abs: 2.3 10*3/uL (ref 0.7–4.0)
MCH: 31.1 pg (ref 26.0–34.0)
MCHC: 35.1 g/dL (ref 30.0–36.0)
MCV: 88.4 fL (ref 78.0–100.0)
Monocytes Absolute: 0.8 10*3/uL (ref 0.1–1.0)
Monocytes Relative: 8 % (ref 3–12)
NEUTROS PCT: 64 % (ref 43–77)
Neutro Abs: 5.9 10*3/uL (ref 1.7–7.7)
PLATELETS: 263 10*3/uL (ref 150–400)
RBC: 4.57 MIL/uL (ref 3.87–5.11)
RDW: 12.5 % (ref 11.5–15.5)
WBC: 9.2 10*3/uL (ref 4.0–10.5)

## 2013-05-14 LAB — COMPREHENSIVE METABOLIC PANEL
ALT: 20 U/L (ref 0–35)
AST: 16 U/L (ref 0–37)
Albumin: 4 g/dL (ref 3.5–5.2)
Alkaline Phosphatase: 61 U/L (ref 39–117)
BUN: 9 mg/dL (ref 6–23)
CALCIUM: 9.4 mg/dL (ref 8.4–10.5)
CO2: 26 mEq/L (ref 19–32)
Chloride: 102 mEq/L (ref 96–112)
Creatinine, Ser: 0.7 mg/dL (ref 0.50–1.10)
GFR calc non Af Amer: >90 mL/min (ref >90)
Glucose, Bld: 102 mg/dL — ABNORMAL HIGH (ref 70–99)
Potassium: 4 mEq/L (ref 3.7–5.3)
SODIUM: 140 meq/L (ref 137–147)
TOTAL PROTEIN: 7.6 g/dL (ref 6.0–8.3)
Total Bilirubin: 0.4 mg/dL (ref 0.3–1.2)

## 2013-05-14 LAB — PREGNANCY, URINE: Preg Test, Ur: NEGATIVE

## 2013-05-14 MED ORDER — SODIUM CHLORIDE 0.9 % IV BOLUS (SEPSIS)
1000.0000 mL | Freq: Once | INTRAVENOUS | Status: AC
  Administered 2013-05-14: 1000 mL via INTRAVENOUS

## 2013-05-14 MED ORDER — ONDANSETRON HCL 4 MG/2ML IJ SOLN
4.0000 mg | Freq: Once | INTRAMUSCULAR | Status: AC
  Administered 2013-05-14: 4 mg via INTRAVENOUS
  Filled 2013-05-14: qty 2

## 2013-05-14 MED ORDER — METHOCARBAMOL 500 MG PO TABS
500.0000 mg | ORAL_TABLET | Freq: Once | ORAL | Status: AC
  Administered 2013-05-14: 500 mg via ORAL
  Filled 2013-05-14: qty 1

## 2013-05-14 MED ORDER — MORPHINE SULFATE 4 MG/ML IJ SOLN
4.0000 mg | Freq: Once | INTRAMUSCULAR | Status: AC
  Administered 2013-05-14: 4 mg via INTRAVENOUS
  Filled 2013-05-14: qty 1

## 2013-05-14 MED ORDER — KETOROLAC TROMETHAMINE 30 MG/ML IJ SOLN
30.0000 mg | Freq: Once | INTRAMUSCULAR | Status: AC
  Administered 2013-05-14: 30 mg via INTRAVENOUS
  Filled 2013-05-14: qty 1

## 2013-05-14 MED ORDER — KETOROLAC TROMETHAMINE 30 MG/ML IJ SOLN: 30.0000 mg | Freq: Once | INTRAMUSCULAR | Status: DC

## 2013-05-14 MED ORDER — ONDANSETRON 4 MG PO TBDP: ORAL_TABLET | ORAL | Status: DC

## 2013-05-14 MED ORDER — ONDANSETRON 4 MG PO TBDP
4.0000 mg | ORAL_TABLET | Freq: Once | ORAL | Status: AC
  Administered 2013-05-14: 4 mg via ORAL
  Filled 2013-05-14: qty 1

## 2013-05-14 MED ORDER — METHOCARBAMOL 500 MG PO TABS: 500.0000 mg | ORAL_TABLET | Freq: Two times a day (BID) | ORAL | Status: DC

## 2013-05-14 MED ORDER — NAPROXEN 500 MG PO TABS
500.0000 mg | ORAL_TABLET | Freq: Every day | ORAL | Status: DC | PRN
Start: 2013-05-14 — End: 2014-06-06

## 2013-05-14 MED ORDER — METHOCARBAMOL 100 MG/ML IJ SOLN
500.0000 mg | Freq: Once | INTRAMUSCULAR | Status: DC
  Filled 2013-05-14: qty 5

## 2013-05-14 MED ORDER — OXYCODONE-ACETAMINOPHEN 5-325 MG PO TABS: 1.0000 | ORAL_TABLET | ORAL | Status: DC | PRN

## 2013-05-14 NOTE — Discharge Instructions (Signed)
Back Pain, Adult Low back pain is very common. About 1 in 5 people have back pain.The cause of low back pain is rarely dangerous. The pain often gets better over time.About half of people with a sudden onset of back pain feel better in just 2 weeks. About 8 in 10 people feel better by 6 weeks.  CAUSES Some common causes of back pain include:  Strain of the muscles or ligaments supporting the spine.  Wear and tear (degeneration) of the spinal discs.  Arthritis.  Direct injury to the back. DIAGNOSIS Most of the time, the direct cause of low back pain is not known.However, back pain can be treated effectively even when the exact cause of the pain is unknown.Answering your caregiver's questions about your overall health and symptoms is one of the most accurate ways to make sure the cause of your pain is not dangerous. If your caregiver needs more information, he or she may order lab work or imaging tests (X-rays or MRIs).However, even if imaging tests show changes in your back, this usually does not require surgery. HOME CARE INSTRUCTIONS For many people, back pain returns.Since low back pain is rarely dangerous, it is often a condition that people can learn to manageon their own.   Remain active. It is stressful on the back to sit or stand in one place. Do not sit, drive, or stand in one place for more than 30 minutes at a time. Take short walks on level surfaces as soon as pain allows.Try to increase the length of time you walk each day.  Do not stay in bed.Resting more than 1 or 2 days can delay your recovery.  Do not avoid exercise or work.Your body is made to move.It is not dangerous to be active, even though your back may hurt.Your back will likely heal faster if you return to being active before your pain is gone.  Pay attention to your body when you bend and lift. Many people have less discomfortwhen lifting if they bend their knees, keep the load close to their bodies,and  avoid twisting. Often, the most comfortable positions are those that put less stress on your recovering back.  Find a comfortable position to sleep. Use a firm mattress and lie on your side with your knees slightly bent. If you lie on your back, put a pillow under your knees.  Only take over-the-counter or prescription medicines as directed by your caregiver. Over-the-counter medicines to reduce pain and inflammation are often the most helpful.Your caregiver may prescribe muscle relaxant drugs.These medicines help dull your pain so you can more quickly return to your normal activities and healthy exercise.  Put ice on the injured area.  Put ice in a plastic bag.  Place a towel between your skin and the bag.  Leave the ice on for 15-20 minutes, 03-04 times a day for the first 2 to 3 days. After that, ice and heat may be alternated to reduce pain and spasms.  Ask your caregiver about trying back exercises and gentle massage. This may be of some benefit.  Avoid feeling anxious or stressed.Stress increases muscle tension and can worsen back pain.It is important to recognize when you are anxious or stressed and learn ways to manage it.Exercise is a great option. SEEK MEDICAL CARE IF:  You have pain that is not relieved with rest or medicine.  You have pain that does not improve in 1 week.  You have new symptoms.  You are generally not feeling well. SEEK   IMMEDIATE MEDICAL CARE IF:   You have pain that radiates from your back into your legs.  You develop new bowel or bladder control problems.  You have unusual weakness or numbness in your arms or legs.  You develop nausea or vomiting.  You develop abdominal pain.  You feel faint. Document Released: 03/04/2005 Document Revised: 09/03/2011 Document Reviewed: 07/23/2010 [employer] Patient Information 2014 [employer].  

## 2013-05-14 NOTE — ED Notes (Addendum)
C/o LLQ and L lower back pain, also nausea. Onset Wednesday, worse tonight. Relates pain to ruptured L ovary. Was seen for same/similar ~ 4 months ago, also reports spotting. LMP 2/9. Last BM today (loose). (denies: v, fever). Also mentions "I was told I have a kidney stone". Here with child. "Has called for another adult to pick child up", alert, NAD, calm, interactive.

## 2013-05-14 NOTE — ED Provider Notes (Signed)
CSN: [id number]     Arrival date & time 05/14/13  0106 History   First MD Initiated Contact with Patient 05/14/13 0126     Chief Complaint  Patient presents with  . Abdominal Pain     (Consider location/radiation/quality/duration/timing/severity/associated sxs/prior Treatment) HPI Patient presents with left lower back pain associated with nausea starting on Wednesday and gradually worsening. The pain is worse with movement or palpation. Patient has no abdominal pain whatsoever. She's had some mild vaginal spotting but denies any discharge. She's had a right oophorectomy and has had questionable left ovarian cysts in the past. The pain in her left lower back does not radiate. She denies any known trauma or heavy lifting. Patient states she had similar pain 4 months ago and was seen in emergency department. She had a CT with noncontrast that showed no renal stones are mildly prominent left ovary. Her ultrasounds showed no ovarian torsion but she did have multiple follicles in her ovary and a small amount of physiologic free fluid in her pelvis. Past Medical History  Diagnosis Date  . UTI (lower urinary tract infection)   . Ovarian cyst rupture   . Kidney stone    History reviewed. No pertinent past surgical history. No family history on file. History  Substance Use Topics  . Smoking status: Current Every Day Smoker -- 0.10 packs/day    Types: Cigarettes  . Smokeless tobacco: Not on file  . Alcohol Use: Yes     Comment: occasional   OB History   Grav Para Term Preterm Abortions TAB SAB Ect Mult Living                 Review of Systems  Constitutional: Negative for fever and chills.  Respiratory: Negative for cough and shortness of breath.   Cardiovascular: Negative for chest pain.  Gastrointestinal: Positive for nausea and vomiting. Negative for abdominal pain, diarrhea and constipation.  Genitourinary: Positive for vaginal bleeding. Negative for dysuria, frequency, hematuria,  flank pain, vaginal discharge and pelvic pain.  Musculoskeletal: Positive for back pain and myalgias. Negative for neck pain and neck stiffness.  Neurological: Negative for dizziness, weakness, light-headedness, numbness and headaches.  All other systems reviewed and are negative.      Allergies  Clindamycin/lincomycin  Home Medications   Current Outpatient Rx  Name  Route  Sig  Dispense  Refill  . naproxen (NAPROSYN) 500 MG tablet   Oral   Take 500 mg by mouth daily as needed (pain).         . methocarbamol (ROBAXIN) 500 MG tablet   Oral   Take 1 tablet (500 mg total) by mouth 2 (two) times daily.   20 tablet   0   . ondansetron (ZOFRAN ODT) 4 MG disintegrating tablet      4mg  ODT q4 hours prn nausea/vomit   8 tablet   0   . oxyCODONE-acetaminophen (PERCOCET) 5-325 MG per tablet   Oral   Take 1-2 tablets by mouth every 4 (four) hours as needed.   20 tablet   0    BP 117/86  Pulse 74  Temp(Src) 98.9 F (37.2 C) (Oral)  Resp 22  SpO2 98%  LMP 03/26/2013 Physical Exam  Nursing note and vitals reviewed. Constitutional: She is oriented to person, place, and time. She appears well-developed and well-nourished. She appears distressed.  Patient is crying in room.  HENT:  Head: Normocephalic and atraumatic.  Mouth/Throat: Oropharynx is clear and moist.  Eyes: EOM are normal. Pupils are  equal, round, and reactive to light.  Neck: Normal range of motion. Neck supple.  Cardiovascular: Normal rate and regular rhythm.   Pulmonary/Chest: Effort normal and breath sounds normal. No respiratory distress. She has no wheezes. She has no rales. She exhibits no tenderness.  Abdominal: Soft. Bowel sounds are normal. She exhibits no distension and no mass. There is no tenderness. There is no rebound and no guarding.  Musculoskeletal: Normal range of motion. She exhibits tenderness (tenderness to palpation over the left lumbar area right above the iliac crest. She has no midline  tenderness. She has no CVA tenderness. There is no obvious injury or deformity.). She exhibits no edema.  Neurological: She is alert and oriented to person, place, and time.  5/5 motor in all extremities. Sensation is grossly intact.  Skin: Skin is warm and dry. No rash noted. No erythema.  Psychiatric: She has a normal mood and affect. Her behavior is normal.    ED Course  Procedures (including critical care time) Labs Review Labs Reviewed  COMPREHENSIVE METABOLIC PANEL - Abnormal; Notable for the following:    Glucose, Bld 102 (*)    All other components within normal limits  CBC WITH DIFFERENTIAL  URINALYSIS, ROUTINE W REFLEX MICROSCOPIC  PREGNANCY, URINE   Imaging Review US Transvaginal Non-ob  05/14/2013   CLINICAL DATA:  Two left lower quadrant abdominal pain.  EXAM: TRANSABDOMINAL AND TRANSVAGINAL ULTRASOUND OF PELVIS  DOPPLER ULTRASOUND OF OVARIES  TECHNIQUE: Both transabdominal and transvaginal ultrasound examinations of the pelvis were performed. Transabdominal technique was performed for global imaging of the pelvis including uterus, ovaries, adnexal regions, and pelvic cul-de-sac.  It was necessary to proceed with endovaginal exam following the transabdominal exam to visualize the uterus and left ovary in greater detail. Color and duplex Doppler ultrasound was utilized to evaluate blood flow to the ovaries.  COMPARISON:  Pelvic ultrasound, and CT of the abdomen and pelvis performed 04/17/2013  FINDINGS: Uterus  Measurements: 7.8 x 3.4 x 4.8 cm. No fibroids or other mass visualized.  Endometrium  Thickness: 0.5 cm. No focal abnormality visualized. The patient's intrauterine device is noted in expected position at the fundus of the uterus.  Right ovary  Status post right-sided oophorectomy.  Left ovary  Measurements: 4.5 x 3.1 x 3.0 cm. Normal appearance/no adnexal mass.  Pulsed Doppler evaluation of the left ovary demonstrates normal low-resistance arterial and venous waveforms.  Other  findings  Trace free fluid is seen within the pelvic cul-de-sac.  IMPRESSION: Unremarkable pelvic ultrasound. Status post right-sided oophorectomy; the left ovary is unremarkable in appearance. No evidence for ovarian torsion.   Electronically Signed   By: Garald Balding M.D.   On: 05/14/2013 04:50   US Pelvis Complete  05/14/2013   CLINICAL DATA:  Two left lower quadrant abdominal pain.  EXAM: TRANSABDOMINAL AND TRANSVAGINAL ULTRASOUND OF PELVIS  DOPPLER ULTRASOUND OF OVARIES  TECHNIQUE: Both transabdominal and transvaginal ultrasound examinations of the pelvis were performed. Transabdominal technique was performed for global imaging of the pelvis including uterus, ovaries, adnexal regions, and pelvic cul-de-sac.  It was necessary to proceed with endovaginal exam following the transabdominal exam to visualize the uterus and left ovary in greater detail. Color and duplex Doppler ultrasound was utilized to evaluate blood flow to the ovaries.  COMPARISON:  Pelvic ultrasound, and CT of the abdomen and pelvis performed 04/17/2013  FINDINGS: Uterus  Measurements: 7.8 x 3.4 x 4.8 cm. No fibroids or other mass visualized.  Endometrium  Thickness: 0.5 cm. No focal abnormality  visualized. The patient's intrauterine device is noted in expected position at the fundus of the uterus.  Right ovary  Status post right-sided oophorectomy.  Left ovary  Measurements: 4.5 x 3.1 x 3.0 cm. Normal appearance/no adnexal mass.  Pulsed Doppler evaluation of the left ovary demonstrates normal low-resistance arterial and venous waveforms.  Other findings  Trace free fluid is seen within the pelvic cul-de-sac.  IMPRESSION: Unremarkable pelvic ultrasound. Status post right-sided oophorectomy; the left ovary is unremarkable in appearance. No evidence for ovarian torsion.   Electronically Signed   By: Garald Balding M.D.   On: 05/14/2013 04:50   [place] Art/ven Flow Abd Pelv Doppler  05/14/2013   CLINICAL DATA:  Two left lower quadrant abdominal  pain.  EXAM: TRANSABDOMINAL AND TRANSVAGINAL ULTRASOUND OF PELVIS  DOPPLER ULTRASOUND OF OVARIES  TECHNIQUE: Both transabdominal and transvaginal ultrasound examinations of the pelvis were performed. Transabdominal technique was performed for global imaging of the pelvis including uterus, ovaries, adnexal regions, and pelvic cul-de-sac.  It was necessary to proceed with endovaginal exam following the transabdominal exam to visualize the uterus and left ovary in greater detail. Color and duplex Doppler ultrasound was utilized to evaluate blood flow to the ovaries.  COMPARISON:  Pelvic ultrasound, and CT of the abdomen and pelvis performed 04/17/2013  FINDINGS: Uterus  Measurements: 7.8 x 3.4 x 4.8 cm. No fibroids or other mass visualized.  Endometrium  Thickness: 0.5 cm. No focal abnormality visualized. The patient's intrauterine device is noted in expected position at the fundus of the uterus.  Right ovary  Status post right-sided oophorectomy.  Left ovary  Measurements: 4.5 x 3.1 x 3.0 cm. Normal appearance/no adnexal mass.  Pulsed Doppler evaluation of the left ovary demonstrates normal low-resistance arterial and venous waveforms.  Other findings  Trace free fluid is seen within the pelvic cul-de-sac.  IMPRESSION: Unremarkable pelvic ultrasound. Status post right-sided oophorectomy; the left ovary is unremarkable in appearance. No evidence for ovarian torsion.   Electronically Signed   By: Garald Balding M.D.   On: 05/14/2013 04:50    EKG Interpretation   None       MDM   Final diagnoses:  Low back pain  Nausea   Unsure of cause of patient's pain. Ovarian pain presenting only as back pain without any abdominal tenderness seems unlikely. Pain appears to be reproducible with palpation over the musculature of the lower back. Will check labs, urine and pelvic ultrasound and reassess.  Patient refusing pelvic exam. She continues to have pain is reproduced with palpation of her left lumbar paraspinal  region. Her abdomen remained soft. Ultrasound without any acute findings. Laboratory workup is normal. Urine is pending.  Patient's pain is improved but continues to be present. Her urine is normal. There is no evidence of infection or red blood cells. I have low suspicion for kidney stone. Suspect muscle spasms of the low back. We'll treat symptomatically the patient's been given thorough return precautions.  Julianne Rice, MD 05/14/13 [phone]

## 2013-08-05 ENCOUNTER — Encounter (HOSPITAL_COMMUNITY): Payer: Self-pay | Admitting: Emergency Medicine

## 2013-08-05 ENCOUNTER — Emergency Department (HOSPITAL_COMMUNITY)
Admission: EM | Admit: 2013-08-05 | Discharge: 2013-08-05 | Disposition: A | Discharge status: Home / Self Care | Payer: Medicaid Other | Source: Home / Self Care | Attending: Emergency Medicine | Admitting: Emergency Medicine

## 2013-08-05 DIAGNOSIS — J020 Streptococcal pharyngitis: Secondary | ICD-10-CM

## 2013-08-05 LAB — POCT RAPID STREP A: STREPTOCOCCUS, GROUP A SCREEN (DIRECT): POSITIVE — AB

## 2013-08-05 MED ORDER — PREDNISONE 20 MG PO TABS: 20.0000 mg | ORAL_TABLET | Freq: Two times a day (BID) | ORAL | Status: DC

## 2013-08-05 MED ORDER — OLOPATADINE HCL 0.2 % OP SOLN: OPHTHALMIC | Status: DC

## 2013-08-05 MED ORDER — AMOXICILLIN 500 MG PO CAPS: 500.0000 mg | ORAL_CAPSULE | Freq: Three times a day (TID) | ORAL | Status: DC

## 2013-08-05 NOTE — Discharge Instructions (Signed)
Strep Throat  Strep throat is an infection of the throat caused by a bacteria named Streptococcus pyogenes. Your caregiver may call the infection streptococcal "tonsillitis" or "pharyngitis" depending on whether there are signs of inflammation in the tonsils or back of the throat. Strep throat is most common in children aged 30 15 years during the cold months of the year, but it can occur in people of any age during any season. This infection is spread from person to person (contagious) through coughing, sneezing, or other close contact.  SYMPTOMS   · Fever or chills.  · Painful, swollen, red tonsils or throat.  · Pain or difficulty when swallowing.  · White or yellow spots on the tonsils or throat.  · Swollen, tender lymph nodes or "glands" of the neck or under the jaw.  · Red rash all over the body (rare).  DIAGNOSIS   Many different infections can cause the same symptoms. A test must be done to confirm the diagnosis so the right treatment can be given. A "rapid strep test" can help your caregiver make the diagnosis in a few minutes. If this test is not available, a light swab of the infected area can be used for a throat culture test. If a throat culture test is done, results are usually available in a day or two.  TREATMENT   Strep throat is treated with antibiotic medicine.  HOME CARE INSTRUCTIONS   · Gargle with 1 tsp of salt in 1 cup of warm water, 3 4 times per day or as needed for comfort.  · Family members who also have a sore throat or fever should be tested for strep throat and treated with antibiotics if they have the strep infection.  · Make sure everyone in your household washes their hands well.  · Do not share food, drinking cups, or personal items that could cause the infection to spread to others.  · You may need to eat a soft food diet until your sore throat gets better.  · Drink enough water and fluids to keep your urine clear or pale yellow. This will help prevent dehydration.  · Get plenty of  rest.  · Stay home from school, daycare, or work until you have been on antibiotics for 24 hours.  · Only take over-the-counter or prescription medicines for pain, discomfort, or fever as directed by your caregiver.  · If antibiotics are prescribed, take them as directed. Finish them even if you start to feel better.  SEEK MEDICAL CARE IF:   · The glands in your neck continue to enlarge.  · You develop a rash, cough, or earache.  · You cough up green, yellow-brown, or bloody sputum.  · You have pain or discomfort not controlled by medicines.  · Your problems seem to be getting worse rather than better.  SEEK IMMEDIATE MEDICAL CARE IF:   · You develop any new symptoms such as vomiting, severe headache, stiff or painful neck, chest pain, shortness of breath, or trouble swallowing.  · You develop severe throat pain, drooling, or changes in your voice.  · You develop swelling of the neck, or the skin on the neck becomes red and tender.  · You have a fever.  · You develop signs of dehydration, such as fatigue, dry mouth, and decreased urination.  · You become increasingly sleepy, or you cannot wake up completely.  Document Released: 03/01/2000 Document Revised: 02/19/2012 Document Reviewed: 05/03/2010  [employer]® Patient Information ©2014 [employer].

## 2013-08-05 NOTE — ED Provider Notes (Signed)
Chief Complaint   Chief Complaint  Patient presents with  . Sore Throat    History of Present Illness   Ashley Francis is a 28 year old female who's daughter had strep. For the past week she's had sore throat, headache, sweats, nasal congestion, itchy eyes, cough, and congested ears. She denies any fever or chills. Has had no GI symptoms.   Review of Systems   Other than as noted above, the patient denies any of the following symptoms. Systemic:  No fever, chills, sweats, myalgias, or headache. Eye:  No redness, pain or drainage. ENT:  No earache, nasal congestion, sneezing, rhinorrhea, sinus pressure, sinus pain, or post nasal drip. Lungs:  No cough, sputum production, wheezing, shortness of breath, or chest pain. GI:  No abdominal pain, nausea, vomiting, or diarrhea. Skin:  No rash.  Myer   Past medical history, family history, social history, meds, and allergies were reviewed.   Physical Exam     Vital signs:  BP 107/71  Pulse 68  Temp(Src) 97.8 F (36.6 C) (Oral)  Resp 16  SpO2 98%  LMP 06/26/2013 General:  Alert, in no distress. Phonation was normal, no drooling, and patient was able to handle secretions well.  Eye:  No conjunctival injection or drainage. Lids were normal. ENT:  TMs and canals were normal, without erythema or inflammation.  Nasal mucosa was clear and uncongested, without drainage.  Mucous membranes were moist.  Exam of pharynx reveals tonsils to be enlarged and red with spots of whitish exudate, right more so than left.  There were no oral ulcerations or lesions. There was no bulging of the tonsillar pillars, and the uvula was midline. Neck:  Supple, no adenopathy, tenderness or mass. Lungs:  No respiratory distress.  Lungs were clear to auscultation, without wheezes, rales or rhonchi.  Breath sounds were clear and equal bilaterally.  Heart:  Regular rhythm, without gallops, murmers or rubs. Skin:  Clear, warm, and dry, without rash or  lesions.  Labs   Results for orders placed during the hospital encounter of 08/05/13  POCT RAPID STREP A ([facility]ARE ONLY)      Result Value Ref Range   Streptococcus, Group A Screen (Direct) POSITIVE (*) NEGATIVE   Assessment   The encounter diagnosis was Strep throat.  There is no evidence of a peritonsillar abscess, retropharyngeal abscess, or epiglottitis.    Plan     1.  Meds:  The following meds were prescribed:   Discharge Medication List as of 08/05/2013  4:05 PM    START taking these medications   Details  amoxicillin (AMOXIL) 500 MG capsule Take 1 capsule (500 mg total) by mouth 3 (three) times daily., Starting 08/05/2013, Until Discontinued, Normal    Olopatadine HCl (PATADAY) 0.2 % SOLN 1 drop in each eye daily for allergies, Normal    predniSONE (DELTASONE) 20 MG tablet Take 1 tablet (20 mg total) by mouth 2 (two) times daily., Starting 08/05/2013, Until Discontinued, Normal        2.  Patient Education/Counseling:  The patient was given appropriate handouts, self care instructions, and instructed in symptomatic relief, including hot saline gargles, throat lozenges, infectious precautions, and need to trade out toothbrush.    3.  Follow up:  The patient was told to follow up here if no better in 3 to 4 days, or sooner if becoming worse in any way, and given some red flag symptoms such as difficulty swallowing or breathing which would prompt immediate return.  Harden Mo, MD 08/05/13 [phone]

## 2013-08-05 NOTE — ED Notes (Signed)
Pt  Reports  Symptoms  Of   sorethroat   -     Headache        As    Well    As        White  Spots  On  Throat    X  1  Week              Child  Has  Strep        Recently

## 2014-03-06 ENCOUNTER — Emergency Department (HOSPITAL_COMMUNITY)
Admission: EM | Admit: 2014-03-06 | Discharge: 2014-03-06 | Disposition: A | Discharge status: Home / Self Care | Payer: Medicaid Other | Attending: Emergency Medicine | Admitting: Emergency Medicine

## 2014-03-06 ENCOUNTER — Encounter (HOSPITAL_COMMUNITY): Payer: Self-pay | Admitting: Nurse Practitioner

## 2014-03-06 DIAGNOSIS — R102 Pelvic and perineal pain: Secondary | ICD-10-CM | POA: Insufficient documentation

## 2014-03-06 DIAGNOSIS — Z792 Long term (current) use of antibiotics: Secondary | ICD-10-CM | POA: Insufficient documentation

## 2014-03-06 DIAGNOSIS — Z720 Tobacco use: Secondary | ICD-10-CM | POA: Insufficient documentation

## 2014-03-06 DIAGNOSIS — Z79899 Other long term (current) drug therapy: Secondary | ICD-10-CM | POA: Diagnosis not present

## 2014-03-06 DIAGNOSIS — K529 Noninfective gastroenteritis and colitis, unspecified: Secondary | ICD-10-CM | POA: Diagnosis not present

## 2014-03-06 DIAGNOSIS — Z791 Long term (current) use of non-steroidal anti-inflammatories (NSAID): Secondary | ICD-10-CM | POA: Diagnosis not present

## 2014-03-06 DIAGNOSIS — Z8742 Personal history of other diseases of the female genital tract: Secondary | ICD-10-CM | POA: Insufficient documentation

## 2014-03-06 DIAGNOSIS — Z87440 Personal history of urinary (tract) infections: Secondary | ICD-10-CM | POA: Diagnosis not present

## 2014-03-06 DIAGNOSIS — R110 Nausea: Secondary | ICD-10-CM | POA: Diagnosis present

## 2014-03-06 DIAGNOSIS — Z3202 Encounter for pregnancy test, result negative: Secondary | ICD-10-CM | POA: Insufficient documentation

## 2014-03-06 DIAGNOSIS — Z7952 Long term (current) use of systemic steroids: Secondary | ICD-10-CM | POA: Insufficient documentation

## 2014-03-06 HISTORY — DX: Malignant neoplasm of cervix uteri, unspecified: C53.9

## 2014-03-06 LAB — CBC WITH DIFFERENTIAL/PLATELET
BASOS ABS: 0 10*3/uL (ref 0.0–0.1)
BASOS PCT: 1 % (ref 0–1)
EOS ABS: 0.3 10*3/uL (ref 0.0–0.7)
Eosinophils Relative: 4 % (ref 0–5)
HEMATOCRIT: 40.8 % (ref 36.0–46.0)
Hemoglobin: 14.3 g/dL (ref 12.0–15.0)
Lymphocytes Relative: 28 % (ref 12–46)
Lymphs Abs: 1.9 10*3/uL (ref 0.7–4.0)
MCH: 30.6 pg (ref 26.0–34.0)
MCHC: 35 g/dL (ref 30.0–36.0)
MCV: 87.2 fL (ref 78.0–100.0)
MONO ABS: 0.3 10*3/uL (ref 0.1–1.0)
Monocytes Relative: 5 % (ref 3–12)
Neutro Abs: 4.1 10*3/uL (ref 1.7–7.7)
Neutrophils Relative %: 62 % (ref 43–77)
Platelets: 296 10*3/uL (ref 150–400)
RBC: 4.68 MIL/uL (ref 3.87–5.11)
RDW: 12.2 % (ref 11.5–15.5)
WBC: 6.6 10*3/uL (ref 4.0–10.5)

## 2014-03-06 LAB — COMPREHENSIVE METABOLIC PANEL
ALBUMIN: 4 g/dL (ref 3.5–5.2)
ALT: 19 U/L (ref 0–35)
ANION GAP: 12 (ref 5–15)
AST: 16 U/L (ref 0–37)
Alkaline Phosphatase: 73 U/L (ref 39–117)
BUN: 8 mg/dL (ref 6–23)
CALCIUM: 9.1 mg/dL (ref 8.4–10.5)
CO2: 24 mEq/L (ref 19–32)
CREATININE: 0.62 mg/dL (ref 0.50–1.10)
Chloride: 102 mEq/L (ref 96–112)
GFR calc Af Amer: >90 mL/min (ref >90)
GFR calc non Af Amer: >90 mL/min (ref >90)
Glucose, Bld: 98 mg/dL (ref 70–99)
Potassium: 4.4 mEq/L (ref 3.7–5.3)
Sodium: 138 mEq/L (ref 137–147)
Total Bilirubin: 0.6 mg/dL (ref 0.3–1.2)
Total Protein: 7.6 g/dL (ref 6.0–8.3)

## 2014-03-06 LAB — URINALYSIS, ROUTINE W REFLEX MICROSCOPIC
Bilirubin Urine: NEGATIVE
Glucose, UA: NEGATIVE mg/dL
KETONES UR: NEGATIVE mg/dL
Leukocytes, UA: NEGATIVE
NITRITE: NEGATIVE
Protein, ur: NEGATIVE mg/dL
SPECIFIC GRAVITY, URINE: 1.023 (ref 1.005–1.030)
UROBILINOGEN UA: 0.2 mg/dL (ref 0.0–1.0)
pH: 5.5 (ref 5.0–8.0)

## 2014-03-06 LAB — URINE MICROSCOPIC-ADD ON

## 2014-03-06 LAB — WET PREP, GENITAL
TRICH WET PREP: NONE SEEN
YEAST WET PREP: NONE SEEN

## 2014-03-06 LAB — POC URINE PREG, ED: PREG TEST UR: NEGATIVE

## 2014-03-06 MED ORDER — ONDANSETRON 4 MG PO TBDP
8.0000 mg | ORAL_TABLET | Freq: Once | ORAL | Status: AC
  Administered 2014-03-06: 8 mg via ORAL
  Filled 2014-03-06: qty 2

## 2014-03-06 MED ORDER — TRAMADOL HCL 50 MG PO TABS: 100.0000 mg | ORAL_TABLET | Freq: Four times a day (QID) | ORAL | Status: DC | PRN

## 2014-03-06 MED ORDER — SODIUM CHLORIDE 0.9 % IV BOLUS (SEPSIS)
1000.0000 mL | Freq: Once | INTRAVENOUS | Status: AC
  Administered 2014-03-06: 1000 mL via INTRAVENOUS

## 2014-03-06 MED ORDER — ONDANSETRON 4 MG PO TBDP: 4.0000 mg | ORAL_TABLET | ORAL | Status: DC | PRN

## 2014-03-06 NOTE — ED Notes (Signed)
Pelvic cart set up at bedside  

## 2014-03-06 NOTE — ED Notes (Signed)
This week she began to have n/v/d/abd pain and distention. shes noticed dark brown discharge from her vagina as well.  She had cervical cryotherapy 3 weeks ago and followed up with OBGYN this week who told her that he did not feel her symptoms were related to the cryotherapy procedure.

## 2014-03-06 NOTE — Discharge Instructions (Signed)
Abdominal Pain, Women °Abdominal (stomach, pelvic, or belly) pain can be caused by many things. It is important to tell your doctor: °· The location of the pain. °· Does it come and go or is it present all the time? °· Are there things that start the pain (eating certain foods, exercise)? °· Are there other symptoms associated with the pain (fever, nausea, vomiting, diarrhea)? °All of this is helpful to know when trying to find the cause of the pain. °CAUSES  °· Stomach: virus or bacteria infection, or ulcer. °· Intestine: appendicitis (inflamed appendix), regional ileitis (Crohn's disease), ulcerative colitis (inflamed colon), irritable bowel syndrome, diverticulitis (inflamed diverticulum of the colon), or cancer of the stomach or intestine. °· Gallbladder disease or stones in the gallbladder. °· Kidney disease, kidney stones, or infection. °· Pancreas infection or cancer. °· Fibromyalgia (pain disorder). °· Diseases of the female organs: °¨ Uterus: fibroid (non-cancerous) tumors or infection. °¨ Fallopian tubes: infection or tubal pregnancy. °¨ Ovary: cysts or tumors. °¨ Pelvic adhesions (scar tissue). °¨ Endometriosis (uterus lining tissue growing in the pelvis and on the pelvic organs). °¨ Pelvic congestion syndrome (female organs filling up with blood just before the menstrual period). °¨ Pain with the menstrual period. °¨ Pain with ovulation (producing an egg). °¨ Pain with an IUD (intrauterine device, birth control) in the uterus. °¨ Cancer of the female organs. °· Functional pain (pain not caused by a disease, may improve without treatment). °· Psychological pain. °· Depression. °DIAGNOSIS  °Your doctor will decide the seriousness of your pain by doing an examination. °· Blood tests. °· X-rays. °· Ultrasound. °· CT scan (computed tomography, special type of X-ray). °· MRI (magnetic resonance imaging). °· Cultures, for infection. °· Barium enema (dye inserted in the large intestine, to better view it with  X-rays). °· Colonoscopy (looking in intestine with a lighted tube). °· Laparoscopy (minor surgery, looking in abdomen with a lighted tube). °· Major abdominal exploratory surgery (looking in abdomen with a large incision). °TREATMENT  °The treatment will depend on the cause of the pain.  °· Many cases can be observed and treated at home. °· Over-the-counter medicines recommended by your caregiver. °· Prescription medicine. °· Antibiotics, for infection. °· Birth control pills, for painful periods or for ovulation pain. °· Hormone treatment, for endometriosis. °· Nerve blocking injections. °· Physical therapy. °· Antidepressants. °· Counseling with a psychologist or psychiatrist. °· Minor or major surgery. °HOME CARE INSTRUCTIONS  °· Do not take laxatives, unless directed by your caregiver. °· Take over-the-counter pain medicine only if ordered by your caregiver. Do not take aspirin because it can cause an upset stomach or bleeding. °· Try a clear liquid diet (broth or water) as ordered by your caregiver. Slowly move to a bland diet, as tolerated, if the pain is related to the stomach or intestine. °· Have a thermometer and take your temperature several times a day, and record it. °· Bed rest and sleep, if it helps the pain. °· Avoid sexual intercourse, if it causes pain. °· Avoid stressful situations. °· Keep your follow-up appointments and tests, as your caregiver orders. °· If the pain does not go away with medicine or surgery, you may try: °¨ Acupuncture. °¨ Relaxation exercises (yoga, meditation). °¨ Group therapy. °¨ Counseling. °SEEK MEDICAL CARE IF:  °· You notice certain foods cause stomach pain. °· Your home care treatment is not helping your pain. °· You need stronger pain medicine. °· You want your IUD removed. °· You feel faint or   lightheaded.  You develop nausea and vomiting.  You develop a rash.  You are having side effects or an allergy to your medicine. SEEK IMMEDIATE MEDICAL CARE IF:   Your  pain does not go away or gets worse.  You have a fever.  Your pain is felt only in portions of the abdomen. The right side could possibly be appendicitis. The left lower portion of the abdomen could be colitis or diverticulitis.  You are passing blood in your stools (bright red or black tarry stools, with or without vomiting).  You have blood in your urine.  You develop chills, with or without a fever.  You pass out. MAKE SURE YOU:   Understand these instructions.  Will watch your condition.  Will get help right away if you are not doing well or get worse. Document Released: 12/30/2006 Document Revised: 07/19/2013 Document Reviewed: 01/19/2009 [facility] Patient Information 2015 [place], [place]. This information is not intended to replace advice given to you by your health care provider. Make sure you discuss any questions you have with your health care provider. Viral Gastroenteritis Viral gastroenteritis is also known as stomach flu. This condition affects the stomach and intestinal tract. It can cause sudden diarrhea and vomiting. The illness typically lasts 3 to 8 days. Most people develop an immune response that eventually gets rid of the virus. While this natural response develops, the virus can make you quite ill. CAUSES  Many different viruses can cause gastroenteritis, such as rotavirus or noroviruses. You can catch one of these viruses by consuming contaminated food or water. You may also catch a virus by sharing utensils or other personal items with an infected person or by touching a contaminated surface. SYMPTOMS  The most common symptoms are diarrhea and vomiting. These problems can cause a severe loss of body fluids (dehydration) and a body salt (electrolyte) imbalance. Other symptoms may include:  Fever.  Headache.  Fatigue.  Abdominal pain. DIAGNOSIS  Your caregiver can usually diagnose viral gastroenteritis based on your symptoms and a physical exam. A stool  sample may also be taken to test for the presence of viruses or other infections. TREATMENT  This illness typically goes away on its own. Treatments are aimed at rehydration. The most serious cases of viral gastroenteritis involve vomiting so severely that you are not able to keep fluids down. In these cases, fluids must be given through an intravenous line (IV). HOME CARE INSTRUCTIONS   Drink enough fluids to keep your urine clear or pale yellow. Drink small amounts of fluids frequently and increase the amounts as tolerated.  Ask your caregiver for specific rehydration instructions.  Avoid:  Foods high in sugar.  Alcohol.  Carbonated drinks.  Tobacco.  Juice.  Caffeine drinks.  Extremely hot or cold fluids.  Fatty, greasy foods.  Too much intake of anything at one time.  Dairy products until 24 to 48 hours after diarrhea stops.  You may consume probiotics. Probiotics are active cultures of beneficial bacteria. They may lessen the amount and number of diarrheal stools in adults. Probiotics can be found in yogurt with active cultures and in supplements.  Wash your hands well to avoid spreading the virus.  Only take over-the-counter or prescription medicines for pain, discomfort, or fever as directed by your caregiver. Do not give aspirin to children. Antidiarrheal medicines are not recommended.  Ask your caregiver if you should continue to take your regular prescribed and over-the-counter medicines.  Keep all follow-up appointments as directed by your caregiver.  SEEK IMMEDIATE MEDICAL CARE IF:   You are unable to keep fluids down.  You do not urinate at least once every 6 to 8 hours.  You develop shortness of breath.  You notice blood in your stool or vomit. This may look like coffee grounds.  You have abdominal pain that increases or is concentrated in one small area (localized).  You have persistent vomiting or diarrhea.  You have a fever.  The patient is a  child younger than 3 months, and he or she has a fever.  The patient is a child older than 3 months, and he or she has a fever and persistent symptoms.  The patient is a child older than 3 months, and he or she has a fever and symptoms suddenly get worse.  The patient is a baby, and he or she has no tears when crying. MAKE SURE YOU:   Understand these instructions.  Will watch your condition.  Will get help right away if you are not doing well or get worse. Document Released: 03/04/2005 Document Revised: 05/27/2011 Document Reviewed: 12/19/2010 [facility] Patient Information 2015 [place], [place]. This information is not intended to replace advice given to you by your health care provider. Make sure you discuss any questions you have with your health care provider.

## 2014-03-06 NOTE — ED Notes (Signed)
MD is comfortable discharging pt without receiving stool specimen.

## 2014-03-06 NOTE — ED Provider Notes (Signed)
CSN: [id number]     Arrival date & time 03/06/14  1452 History   First MD Initiated Contact with Patient 03/06/14 1558     Chief Complaint  Patient presents with  . Nausea     (Consider location/radiation/quality/duration/timing/severity/associated sxs/prior Treatment) HPI The patient reports that she's had vomiting and diarrhea this week. She reports recurrent diarrhea of which is sometimes incontinent. She also reports recurrent episodes of vomiting. The patient reports that her abdomen feels distended and tender. She reports that the pain is diffuse and on both sides from her central abdomen to her lower abdomen. It's an aching, cramping sensation. She reports she had a cervical cryotherapy procedure 3 weeks ago. She followed up with her gynecologist last week because she thought some of the symptoms might be associated. She reports that her gynecologist did not feel that her other symptoms were in association with her procedure. Reports she has had some dark brownish and blood tinged vaginal spotting. She reports at this point from the vomiting and diarrhea she just feels very weak and fatigued. Past Medical History  Diagnosis Date  . UTI (lower urinary tract infection)   . Ovarian cyst rupture   . Kidney stone   . Cervical cancer    History reviewed. No pertinent past surgical history. History reviewed. No pertinent family history. History  Substance Use Topics  . Smoking status: Current Every Day Smoker -- 0.10 packs/day    Types: Cigarettes  . Smokeless tobacco: Not on file  . Alcohol Use: Yes     Comment: occasional   OB History    No data available     Review of Systems 10 Systems reviewed and are negative for acute change except as noted in the HPI.    Allergies  Clindamycin/lincomycin  Home Medications   Prior to Admission medications   Medication Sig Start Date End Date Taking? Authorizing Provider  clonazePAM (KLONOPIN) 0.5 MG tablet Take 0.5 mg by mouth  daily as needed for anxiety.   Yes Historical Provider, MD  FLUoxetine (PROZAC) 20 MG capsule Take 10 mg by mouth 3 (three) times daily.   Yes Historical Provider, MD  levonorgestrel (MIRENA) 20 MCG/24HR IUD 1 each by Intrauterine route once.   Yes Historical Provider, MD  amoxicillin (AMOXIL) 500 MG capsule Take 1 capsule (500 mg total) by mouth 3 (three) times daily. Patient not taking: Reported on 03/06/2014 08/05/13   Harden Mo, MD  methocarbamol (ROBAXIN) 500 MG tablet Take 1 tablet (500 mg total) by mouth 2 (two) times daily. Patient not taking: Reported on 03/06/2014 05/14/13   Julianne Rice, MD  naproxen (NAPROSYN) 500 MG tablet Take 1 tablet (500 mg total) by mouth daily as needed (pain). Patient not taking: Reported on 03/06/2014 05/14/13   Julianne Rice, MD  Olopatadine HCl (PATADAY) 0.2 % SOLN 1 drop in each eye daily for allergies Patient not taking: Reported on 03/06/2014 08/05/13   Harden Mo, MD  ondansetron (ZOFRAN ODT) 4 MG disintegrating tablet 4mg  ODT q4 hours prn nausea/vomit Patient not taking: Reported on 03/06/2014 05/14/13   Julianne Rice, MD  ondansetron (ZOFRAN ODT) 4 MG disintegrating tablet Take 1 tablet (4 mg total) by mouth every 4 (four) hours as needed for nausea or vomiting. 03/06/14   Charlesetta Shanks, MD  oxyCODONE-acetaminophen (PERCOCET) 5-325 MG per tablet Take 1-2 tablets by mouth every 4 (four) hours as needed. Patient not taking: Reported on 03/06/2014 05/14/13   Julianne Rice, MD  predniSONE (DELTASONE) 20 MG tablet  Take 1 tablet (20 mg total) by mouth 2 (two) times daily. Patient not taking: Reported on 03/06/2014 08/05/13   Harden Mo, MD  traMADol (ULTRAM) 50 MG tablet Take 2 tablets (100 mg total) by mouth every 6 (six) hours as needed. 03/06/14   Charlesetta Shanks, MD   BP 106/75 mmHg  Pulse 72  Temp(Src) 98.2 F (36.8 C) (Oral)  Resp 15  Ht 5\' 4"  (1.626 m)  Wt 223 lb (101.152 kg)  BMI 38.26 kg/m2  SpO2 97% Physical Exam   Constitutional: She is oriented to person, place, and time.  The patient is moderately obese but well-nourished and well-developed. She is nontoxic and alert appearance. No respiratory distress.  HENT:  Head: Normocephalic and atraumatic.  Eyes: EOM are normal. Pupils are equal, round, and reactive to light.  Neck: Neck supple.  Cardiovascular: Normal rate, regular rhythm, normal heart sounds and intact distal pulses.   Pulmonary/Chest: Effort normal and breath sounds normal. No respiratory distress. She has no wheezes. She has no rales.  Abdominal: Soft. Bowel sounds are normal. She exhibits distension. She exhibits no mass. There is tenderness (patient is diffusely tender from just above her umbilicus to the suprapubic area bilaterally and symmetrically. There is no guarding present.). There is no rebound and no guarding.  Genitourinary: Vagina normal.  Speculum exam shows a scant amount of brown blood in the vaginal vault. The cervix is healing well. There is no purulent appearing drainage or discharge. Cervix is not friable. A small amount of blood-tinged mucus from the cervical os. I manual examination is positive for diffuse pelvic tenderness bilaterally. Pain centers most in the midline.  Musculoskeletal: She exhibits no edema or tenderness.  Neurological: She is alert and oriented to person, place, and time. No cranial nerve deficit. Coordination normal.  Skin: Skin is warm and dry. No erythema.  Psychiatric: She has a normal mood and affect.    ED Course  Procedures (including critical care time) Labs Review Labs Reviewed  URINALYSIS, ROUTINE W REFLEX MICROSCOPIC - Abnormal; Notable for the following:    Color, Urine AMBER (*)    APPearance CLOUDY (*)    Hgb urine dipstick LARGE (*)    All other components within normal limits  URINE MICROSCOPIC-ADD ON - Abnormal; Notable for the following:    Squamous Epithelial / LPF FEW (*)    All other components within normal limits   STOOL CULTURE  CLOSTRIDIUM DIFFICILE BY PCR  GC/CHLAMYDIA PROBE AMP  WET PREP, GENITAL  CBC WITH DIFFERENTIAL  COMPREHENSIVE METABOLIC PANEL  POC URINE PREG, ED    Imaging Review No results found.   EKG Interpretation None      MDM   Final diagnoses:  Gastroenteritis  Pelvic pain in female   At this point the patient has signs of gastroenteritis with vomiting and diarrhea. She does not appear to have C. difficile risk factors. She denies recent antibiotic use. Amoxicillin is listed on her med record but she reports she is not taking that. She does not have any exposures. She reports she is mostly home and has not had any sick contacts. Patient does endorse pelvic pain throughout. This is not localizing to the right or the left. She did have her cryotherapy 3 weeks ago. At this time there do not appear to be acute complications. Although she is tender there is no purulent appearing discharge. Patient's abdominal tenderness may be secondary to gastroenteritis. She does not show any signs of an acute surgical condition.  She does have follow-up tomorrow with her family physician. Feel she is safe for discharge and close follow-up.    Charlesetta Shanks, MD 03/06/14 [phone]

## 2014-03-07 LAB — GC/CHLAMYDIA PROBE AMP
CT Probe RNA: NEGATIVE
GC Probe RNA: NEGATIVE

## 2014-06-06 ENCOUNTER — Encounter (HOSPITAL_COMMUNITY): Payer: Self-pay | Admitting: Emergency Medicine

## 2014-06-06 ENCOUNTER — Emergency Department (INDEPENDENT_AMBULATORY_CARE_PROVIDER_SITE_OTHER)
Admission: EM | Admit: 2014-06-06 | Discharge: 2014-06-06 | Disposition: A | Discharge status: Home / Self Care | Payer: Medicaid Other | Source: Home / Self Care | Attending: Emergency Medicine | Admitting: Emergency Medicine

## 2014-06-06 DIAGNOSIS — J209 Acute bronchitis, unspecified: Secondary | ICD-10-CM

## 2014-06-06 MED ORDER — IBUPROFEN 600 MG PO TABS
600.0000 mg | ORAL_TABLET | Freq: Four times a day (QID) | ORAL | Status: DC | PRN
Start: 2014-06-06 — End: 2014-07-29

## 2014-06-06 MED ORDER — BENZONATATE 200 MG PO CAPS: 200.0000 mg | ORAL_CAPSULE | Freq: Two times a day (BID) | ORAL | Status: DC | PRN

## 2014-06-06 MED ORDER — AZITHROMYCIN 250 MG PO TABS: ORAL_TABLET | ORAL | Status: DC

## 2014-06-06 MED ORDER — PREDNISONE 50 MG PO TABS: ORAL_TABLET | ORAL | Status: DC

## 2014-06-06 NOTE — ED Notes (Signed)
Pt has been suffering from a cough, sinus pressure, headache and a sore throat since Friday.  Pt denies any fever.

## 2014-06-06 NOTE — Discharge Instructions (Signed)
You have bronchitis. Take prednisone and azithromycin as prescribed. Use the Tessalon twice a day as needed for cough. Follow-up as needed.

## 2014-06-06 NOTE — ED Provider Notes (Signed)
CSN: [id number]     Arrival date & time 06/06/14  1736 History   First MD Initiated Contact with Patient 06/06/14 1913     Chief Complaint  Patient presents with  . Sore Throat  . Headache  . Cough  . Facial Pain   (Consider location/radiation/quality/duration/timing/severity/associated sxs/prior Treatment) HPI  She is a 29 year old woman here for evaluation of cough. She states her symptoms started on Friday with laryngitis. Over the last few days she has developed nasal congestion, sinus pressure, sore throat, cough, mild body aches, and fatigue. She denies any nausea or vomiting. No diarrhea. No fevers. She has taken DayQuil with some improvement. No shortness of breath. No chest pain.  Past Medical History  Diagnosis Date  . UTI (lower urinary tract infection)   . Ovarian cyst rupture   . Kidney stone   . Cervical cancer    History reviewed. No pertinent past surgical history. History reviewed. No pertinent family history. History  Substance Use Topics  . Smoking status: Current Every Day Smoker -- 0.10 packs/day    Types: Cigarettes  . Smokeless tobacco: Not on file  . Alcohol Use: Yes     Comment: occasional   OB History    No data available     Review of Systems  Constitutional: Positive for fatigue. Negative for fever and chills.  HENT: Positive for congestion, sinus pressure, sore throat and voice change. Negative for rhinorrhea.   Respiratory: Positive for cough. Negative for shortness of breath.   Cardiovascular: Negative for chest pain.  Gastrointestinal: Negative for nausea, vomiting and diarrhea.  Musculoskeletal: Positive for myalgias.  Neurological: Positive for headaches.    Allergies  Clindamycin/lincomycin  Home Medications   Prior to Admission medications   Medication Sig Start Date End Date Taking? Authorizing Provider  FLUoxetine (PROZAC) 20 MG capsule Take 10 mg by mouth 3 (three) times daily.   Yes Historical Provider, MD  levonorgestrel  (MIRENA) 20 MCG/24HR IUD 1 each by Intrauterine route once.   Yes Historical Provider, MD  azithromycin (ZITHROMAX Z-PAK) 250 MG tablet Take 2 pills today, then 1 pill daily until gone. 06/06/14   Melony Overly, MD  benzonatate (TESSALON) 200 MG capsule Take 1 capsule (200 mg total) by mouth 2 (two) times daily as needed for cough. 06/06/14   Melony Overly, MD  clonazePAM (KLONOPIN) 0.5 MG tablet Take 0.5 mg by mouth daily as needed for anxiety.    Historical Provider, MD  ondansetron (ZOFRAN ODT) 4 MG disintegrating tablet Take 1 tablet (4 mg total) by mouth every 4 (four) hours as needed for nausea or vomiting. 03/06/14   Charlesetta Shanks, MD  predniSONE (DELTASONE) 50 MG tablet Take 1 pill daily for 5 days. 06/06/14   Melony Overly, MD  traMADol (ULTRAM) 50 MG tablet Take 2 tablets (100 mg total) by mouth every 6 (six) hours as needed. 03/06/14   Charlesetta Shanks, MD   BP 124/69 mmHg  Pulse 67  Temp(Src) 97.6 F (36.4 C) (Oral)  Resp 16  SpO2 99% Physical Exam  Constitutional: She is oriented to person, place, and time. She appears well-developed and well-nourished. No distress.  HENT:  Head: Normocephalic and atraumatic.  Right Ear: External ear normal.  Left Ear: External ear normal.  Nose: Nose normal.  Mouth/Throat: Posterior oropharyngeal erythema present. No oropharyngeal exudate.  Neck: Neck supple.  Cardiovascular: Normal rate, regular rhythm and normal heart sounds.   No murmur heard. Pulmonary/Chest: Effort normal and breath sounds normal. No  respiratory distress. She has no wheezes. She has no rales.  Lymphadenopathy:    She has no cervical adenopathy.  Neurological: She is alert and oriented to person, place, and time.    ED Course  Procedures (including critical care time) Labs Review Labs Reviewed - No data to display  Imaging Review No results found.   MDM   1. Acute bronchitis, unspecified organism    We'll treat with prednisone and a Z-Pak. TEssalon for  cough. Follow-up as needed.    Melony Overly, MD 06/06/14 [phone]

## 2014-07-19 ENCOUNTER — Other Ambulatory Visit: Payer: Self-pay | Admitting: Obstetrics & Gynecology

## 2014-07-24 NOTE — Patient Instructions (Addendum)
   Your procedure is scheduled on: Friday, May 13  Enter through the Main Entrance of [facility] at: 6 AM Pick up the phone at the desk and dial [phone] and inform us of your arrival.  Please call this number if you have any problems the morning of surgery: [phone]  Remember: Do not eat or drink after midnight: Thursday Take these medicines the morning of surgery with a SIP OF WATER:  Klonopin and zantac  Do not wear jewelry, make-up, or FINGER nail polish No metal in your hair or on your body. Do not wear lotions, powders, perfumes.  You may wear deodorant.  Do not bring valuables to the hospital. Contacts, dentures or bridgework may not be worn into surgery.  Patients discharged on the day of surgery will not be allowed to drive home.  Home with mother Thayer Headings cell [phone]

## 2014-07-25 ENCOUNTER — Encounter (HOSPITAL_COMMUNITY): Payer: Self-pay

## 2014-07-25 ENCOUNTER — Encounter (HOSPITAL_COMMUNITY)
Admission: RE | Admit: 2014-07-25 | Discharge: 2014-07-25 | Disposition: A | Discharge status: Home / Self Care | Payer: Medicaid Other | Source: Ambulatory Visit | Attending: Obstetrics & Gynecology | Admitting: Obstetrics & Gynecology

## 2014-07-25 DIAGNOSIS — Z01818 Encounter for other preprocedural examination: Secondary | ICD-10-CM | POA: Insufficient documentation

## 2014-07-25 HISTORY — DX: Anxiety disorder, unspecified: F41.9

## 2014-07-25 HISTORY — DX: Headache, unspecified: R51.9

## 2014-07-25 HISTORY — DX: Bronchitis, not specified as acute or chronic: J40

## 2014-07-25 HISTORY — DX: Depression, unspecified: F32.A

## 2014-07-25 HISTORY — DX: Gastro-esophageal reflux disease without esophagitis: K21.9

## 2014-07-25 HISTORY — DX: Major depressive disorder, single episode, unspecified: F32.9

## 2014-07-25 HISTORY — DX: Headache: R51

## 2014-07-25 LAB — CBC
HCT: 42.5 % (ref 36.0–46.0)
Hemoglobin: 14.9 g/dL (ref 12.0–15.0)
MCH: 32 pg (ref 26.0–34.0)
MCHC: 35.1 g/dL (ref 30.0–36.0)
MCV: 91.4 fL (ref 78.0–100.0)
Platelets: 297 10*3/uL (ref 150–400)
RBC: 4.65 MIL/uL (ref 3.87–5.11)
RDW: 12.7 % (ref 11.5–15.5)
WBC: 8.4 10*3/uL (ref 4.0–10.5)

## 2014-07-28 NOTE — Anesthesia Preprocedure Evaluation (Addendum)
Anesthesia Evaluation  Patient identified by MRN, date of birth, ID band Patient awake    Reviewed: Allergy & Precautions, NPO status , Patient's Chart, lab work & pertinent test results  History of Anesthesia Complications Negative for: history of anesthetic complications  Airway Mallampati: II  TM Distance: >3 FB Neck ROM: Full    Dental no notable dental hx. (+) Dental Advisory Given, Missing, Poor Dentition   Pulmonary Current Smoker,  breath sounds clear to auscultation  Pulmonary exam normal       Cardiovascular negative cardio ROS Normal cardiovascular examRhythm:Regular Rate:Normal     Neuro/Psych  Headaches, PSYCHIATRIC DISORDERS Anxiety Depression negative psych ROS   GI/Hepatic Neg liver ROS, GERD-  Medicated and Controlled,  Endo/Other  Morbid obesity  Renal/GU negative Renal ROS  negative genitourinary   Musculoskeletal negative musculoskeletal ROS (+)   Abdominal   Peds negative pediatric ROS (+)  Hematology negative hematology ROS (+)   Anesthesia Other Findings   Reproductive/Obstetrics negative OB ROS                           Anesthesia Physical Anesthesia Plan  ASA: III  Anesthesia Plan: General   Post-op Pain Management:    Induction: Intravenous  Airway Management Planned: Oral ETT  Additional Equipment:   Intra-op Plan:   Post-operative Plan: Extubation in OR  Informed Consent: I have reviewed the patients History and Physical, chart, labs and discussed the procedure including the risks, benefits and alternatives for the proposed anesthesia with the patient or authorized representative who has indicated his/her understanding and acceptance.   Dental advisory given  Plan Discussed with: CRNA  Anesthesia Plan Comments:         Anesthesia Quick Evaluation

## 2014-07-29 ENCOUNTER — Encounter (HOSPITAL_COMMUNITY)
Admission: RE | Disposition: A | Discharge status: Home / Self Care | Payer: Self-pay | Source: Ambulatory Visit | Attending: Obstetrics & Gynecology

## 2014-07-29 ENCOUNTER — Encounter (HOSPITAL_COMMUNITY): Payer: Self-pay | Admitting: Anesthesiology

## 2014-07-29 ENCOUNTER — Ambulatory Visit (HOSPITAL_COMMUNITY)
Admission: RE | Admit: 2014-07-29 | Discharge: 2014-07-29 | Disposition: A | Discharge status: Home / Self Care | Payer: Medicaid Other | Source: Ambulatory Visit | Attending: Obstetrics & Gynecology | Admitting: Obstetrics & Gynecology

## 2014-07-29 ENCOUNTER — Ambulatory Visit (HOSPITAL_COMMUNITY): Payer: Medicaid Other | Admitting: Anesthesiology

## 2014-07-29 DIAGNOSIS — N736 Female pelvic peritoneal adhesions (postinfective): Secondary | ICD-10-CM | POA: Diagnosis not present

## 2014-07-29 DIAGNOSIS — R102 Pelvic and perineal pain: Secondary | ICD-10-CM | POA: Diagnosis present

## 2014-07-29 DIAGNOSIS — Z87440 Personal history of urinary (tract) infections: Secondary | ICD-10-CM | POA: Diagnosis not present

## 2014-07-29 DIAGNOSIS — Z8541 Personal history of malignant neoplasm of cervix uteri: Secondary | ICD-10-CM | POA: Diagnosis not present

## 2014-07-29 DIAGNOSIS — F419 Anxiety disorder, unspecified: Secondary | ICD-10-CM | POA: Insufficient documentation

## 2014-07-29 DIAGNOSIS — Z87442 Personal history of urinary calculi: Secondary | ICD-10-CM | POA: Diagnosis not present

## 2014-07-29 DIAGNOSIS — K219 Gastro-esophageal reflux disease without esophagitis: Secondary | ICD-10-CM | POA: Diagnosis not present

## 2014-07-29 DIAGNOSIS — Z6838 Body mass index (BMI) 38.0-38.9, adult: Secondary | ICD-10-CM | POA: Diagnosis not present

## 2014-07-29 DIAGNOSIS — F329 Major depressive disorder, single episode, unspecified: Secondary | ICD-10-CM | POA: Insufficient documentation

## 2014-07-29 DIAGNOSIS — Z9049 Acquired absence of other specified parts of digestive tract: Secondary | ICD-10-CM | POA: Diagnosis not present

## 2014-07-29 DIAGNOSIS — Z881 Allergy status to other antibiotic agents status: Secondary | ICD-10-CM | POA: Insufficient documentation

## 2014-07-29 DIAGNOSIS — F17210 Nicotine dependence, cigarettes, uncomplicated: Secondary | ICD-10-CM | POA: Diagnosis not present

## 2014-07-29 DIAGNOSIS — N946 Dysmenorrhea, unspecified: Secondary | ICD-10-CM | POA: Diagnosis not present

## 2014-07-29 HISTORY — PX: LAPAROSCOPIC LYSIS OF ADHESIONS: SHX5905

## 2014-07-29 HISTORY — PX: LAPAROSCOPY: SHX197

## 2014-07-29 LAB — PREGNANCY, URINE: PREG TEST UR: NEGATIVE

## 2014-07-29 SURGERY — LAPAROSCOPY OPERATIVE
Anesthesia: General | Site: Abdomen

## 2014-07-29 MED ORDER — ACETAMINOPHEN 10 MG/ML IV SOLN
1000.0000 mg | Freq: Once | INTRAVENOUS | Status: AC
  Administered 2014-07-29: 1000 mg via INTRAVENOUS
  Filled 2014-07-29: qty 100

## 2014-07-29 MED ORDER — PROPOFOL 10 MG/ML IV BOLUS
INTRAVENOUS | Status: DC | PRN
  Administered 2014-07-29: 180 mg via INTRAVENOUS

## 2014-07-29 MED ORDER — FENTANYL CITRATE (PF) 100 MCG/2ML IJ SOLN
INTRAMUSCULAR | Status: DC | PRN
  Administered 2014-07-29 (×5): 50 ug via INTRAVENOUS

## 2014-07-29 MED ORDER — SCOPOLAMINE 1 MG/3DAYS TD PT72
MEDICATED_PATCH | TRANSDERMAL | Status: AC
  Administered 2014-07-29: 1.5 mg via TRANSDERMAL
  Filled 2014-07-29: qty 1

## 2014-07-29 MED ORDER — FAMOTIDINE IN NACL 20-0.9 MG/50ML-% IV SOLN: 20.0000 mg | Freq: Once | INTRAVENOUS | Status: DC

## 2014-07-29 MED ORDER — EPHEDRINE SULFATE 50 MG/ML IJ SOLN
INTRAMUSCULAR | Status: DC | PRN
  Administered 2014-07-29 (×2): 10 mg via INTRAVENOUS

## 2014-07-29 MED ORDER — FENTANYL CITRATE (PF) 100 MCG/2ML IJ SOLN
INTRAMUSCULAR | Status: AC
  Filled 2014-07-29: qty 2

## 2014-07-29 MED ORDER — HYDROMORPHONE HCL 1 MG/ML IJ SOLN: 0.2000 mg | INTRAMUSCULAR | Status: DC | PRN

## 2014-07-29 MED ORDER — OXYCODONE-ACETAMINOPHEN 5-325 MG PO TABS
1.0000 | ORAL_TABLET | ORAL | Status: DC | PRN
Start: 2014-07-29 — End: 2014-07-29
  Administered 2014-07-29: 1 via ORAL

## 2014-07-29 MED ORDER — PROPOFOL 10 MG/ML IV BOLUS
INTRAVENOUS | Status: AC
  Filled 2014-07-29: qty 20

## 2014-07-29 MED ORDER — ONDANSETRON HCL 4 MG/2ML IJ SOLN: 4.0000 mg | Freq: Once | INTRAMUSCULAR | Status: DC | PRN

## 2014-07-29 MED ORDER — GLYCOPYRROLATE 0.2 MG/ML IJ SOLN
INTRAMUSCULAR | Status: AC
  Filled 2014-07-29: qty 3

## 2014-07-29 MED ORDER — KETOROLAC TROMETHAMINE 30 MG/ML IJ SOLN: 30.0000 mg | Freq: Once | INTRAMUSCULAR | Status: DC

## 2014-07-29 MED ORDER — ONDANSETRON HCL 4 MG/2ML IJ SOLN
INTRAMUSCULAR | Status: AC
  Filled 2014-07-29: qty 2

## 2014-07-29 MED ORDER — CEFAZOLIN SODIUM-DEXTROSE 2-3 GM-% IV SOLR
2.0000 g | INTRAVENOUS | Status: AC
  Administered 2014-07-29: 2 g via INTRAVENOUS

## 2014-07-29 MED ORDER — NEOSTIGMINE METHYLSULFATE 10 MG/10ML IV SOLN
INTRAVENOUS | Status: DC | PRN
  Administered 2014-07-29: 4 mg via INTRAVENOUS

## 2014-07-29 MED ORDER — LIDOCAINE HCL (CARDIAC) 20 MG/ML IV SOLN
INTRAVENOUS | Status: DC | PRN
  Administered 2014-07-29: 30 mg via INTRAVENOUS

## 2014-07-29 MED ORDER — LIDOCAINE HCL (CARDIAC) 20 MG/ML IV SOLN
INTRAVENOUS | Status: AC
  Filled 2014-07-29: qty 5

## 2014-07-29 MED ORDER — FENTANYL CITRATE (PF) 250 MCG/5ML IJ SOLN
INTRAMUSCULAR | Status: AC
  Filled 2014-07-29: qty 5

## 2014-07-29 MED ORDER — KETOROLAC TROMETHAMINE 30 MG/ML IJ SOLN
INTRAMUSCULAR | Status: AC
  Filled 2014-07-29: qty 1

## 2014-07-29 MED ORDER — ROCURONIUM BROMIDE 100 MG/10ML IV SOLN
INTRAVENOUS | Status: AC
  Filled 2014-07-29: qty 1

## 2014-07-29 MED ORDER — IBUPROFEN 600 MG PO TABS
600.0000 mg | ORAL_TABLET | Freq: Four times a day (QID) | ORAL | Status: DC | PRN
Start: 2014-07-29 — End: 2016-07-14

## 2014-07-29 MED ORDER — FENTANYL CITRATE (PF) 100 MCG/2ML IJ SOLN
25.0000 ug | INTRAMUSCULAR | Status: DC | PRN
  Administered 2014-07-29 (×3): 50 ug via INTRAVENOUS

## 2014-07-29 MED ORDER — BUPIVACAINE HCL 0.25 % IJ SOLN
INTRAMUSCULAR | Status: DC | PRN
  Administered 2014-07-29: 25 mL

## 2014-07-29 MED ORDER — MIDAZOLAM HCL 2 MG/2ML IJ SOLN
INTRAMUSCULAR | Status: AC
  Filled 2014-07-29: qty 2

## 2014-07-29 MED ORDER — DEXAMETHASONE SODIUM PHOSPHATE 4 MG/ML IJ SOLN
INTRAMUSCULAR | Status: AC
  Filled 2014-07-29: qty 1

## 2014-07-29 MED ORDER — SIMETHICONE 80 MG PO CHEW
80.0000 mg | CHEWABLE_TABLET | Freq: Four times a day (QID) | ORAL | Status: DC | PRN
  Filled 2014-07-29: qty 1

## 2014-07-29 MED ORDER — DEXAMETHASONE SODIUM PHOSPHATE 10 MG/ML IJ SOLN
INTRAMUSCULAR | Status: DC | PRN
  Administered 2014-07-29: 4 mg via INTRAVENOUS

## 2014-07-29 MED ORDER — MIDAZOLAM HCL 2 MG/2ML IJ SOLN
INTRAMUSCULAR | Status: DC | PRN
Start: 2014-07-29 — End: 2014-07-29
  Administered 2014-07-29: 2 mg via INTRAVENOUS

## 2014-07-29 MED ORDER — LACTATED RINGERS IV SOLN
INTRAVENOUS | Status: DC
  Administered 2014-07-29 (×2): via INTRAVENOUS

## 2014-07-29 MED ORDER — LACTATED RINGERS IV SOLN: INTRAVENOUS | Status: DC

## 2014-07-29 MED ORDER — GLYCOPYRROLATE 0.2 MG/ML IJ SOLN
INTRAMUSCULAR | Status: DC | PRN
  Administered 2014-07-29: 0.6 mg via INTRAVENOUS

## 2014-07-29 MED ORDER — ROCURONIUM BROMIDE 100 MG/10ML IV SOLN
INTRAVENOUS | Status: DC | PRN
  Administered 2014-07-29: 35 mg via INTRAVENOUS
  Administered 2014-07-29: 15 mg via INTRAVENOUS

## 2014-07-29 MED ORDER — OXYCODONE-ACETAMINOPHEN 5-325 MG PO TABS
ORAL_TABLET | ORAL | Status: AC
  Filled 2014-07-29: qty 1

## 2014-07-29 MED ORDER — NEOSTIGMINE METHYLSULFATE 10 MG/10ML IV SOLN
INTRAVENOUS | Status: AC
  Filled 2014-07-29: qty 1

## 2014-07-29 MED ORDER — CEFAZOLIN SODIUM-DEXTROSE 2-3 GM-% IV SOLR
INTRAVENOUS | Status: AC
  Filled 2014-07-29: qty 50

## 2014-07-29 MED ORDER — SCOPOLAMINE 1 MG/3DAYS TD PT72
1.0000 | MEDICATED_PATCH | TRANSDERMAL | Status: DC
  Administered 2014-07-29: 1.5 mg via TRANSDERMAL

## 2014-07-29 MED ORDER — BUPIVACAINE HCL (PF) 0.25 % IJ SOLN
INTRAMUSCULAR | Status: AC
  Filled 2014-07-29: qty 30

## 2014-07-29 MED ORDER — 0.9 % SODIUM CHLORIDE (POUR BTL) OPTIME
TOPICAL | Status: DC | PRN
  Administered 2014-07-29: 1000 mL

## 2014-07-29 MED ORDER — ONDANSETRON HCL 4 MG PO TABS: 4.0000 mg | ORAL_TABLET | Freq: Four times a day (QID) | ORAL | Status: DC | PRN

## 2014-07-29 MED ORDER — ONDANSETRON HCL 4 MG/2ML IJ SOLN
INTRAMUSCULAR | Status: DC | PRN
  Administered 2014-07-29: 4 mg via INTRAVENOUS

## 2014-07-29 MED ORDER — OXYCODONE-ACETAMINOPHEN 5-325 MG PO TABS: 1.0000 | ORAL_TABLET | ORAL | Status: DC | PRN

## 2014-07-29 MED ORDER — EPHEDRINE 5 MG/ML INJ
INTRAVENOUS | Status: AC
  Filled 2014-07-29: qty 10

## 2014-07-29 MED ORDER — ONDANSETRON HCL 4 MG/2ML IJ SOLN: 4.0000 mg | Freq: Four times a day (QID) | INTRAMUSCULAR | Status: DC | PRN

## 2014-07-29 SURGICAL SUPPLY — 35 items
BENZOIN TINCTURE PRP APPL 2/3 (GAUZE/BANDAGES/DRESSINGS) ×3 IMPLANT
CABLE HIGH FREQUENCY MONO STRZ (ELECTRODE) IMPLANT
CATH ROBINSON RED A/P 16FR (CATHETERS) ×3 IMPLANT
CLOSURE WOUND 1/4X4 (GAUZE/BANDAGES/DRESSINGS) ×1
CLOTH BEACON ORANGE TIMEOUT ST (SAFETY) ×3 IMPLANT
DRSG COVADERM PLUS 2X2 (GAUZE/BANDAGES/DRESSINGS) IMPLANT
DRSG OPSITE POSTOP 3X4 (GAUZE/BANDAGES/DRESSINGS) ×3 IMPLANT
DURAPREP 26ML APPLICATOR (WOUND CARE) ×3 IMPLANT
GLOVE BIO SURGEON STRL SZ 6.5 (GLOVE) ×2 IMPLANT
GLOVE BIO SURGEONS STRL SZ 6.5 (GLOVE) ×1
GLOVE BIOGEL PI IND STRL 7.0 (GLOVE) ×2 IMPLANT
GLOVE BIOGEL PI INDICATOR 7.0 (GLOVE) ×4
GOWN STRL REUS W/TWL LRG LVL3 (GOWN DISPOSABLE) ×6 IMPLANT
LIGASURE 5MM LAPAROSCOPIC (INSTRUMENTS) ×3 IMPLANT
LIQUID BAND (GAUZE/BANDAGES/DRESSINGS) ×3 IMPLANT
NEEDLE INSUFFLATION 120MM (ENDOMECHANICALS) IMPLANT
NS IRRIG 1000ML POUR BTL (IV SOLUTION) ×3 IMPLANT
PACK LAPAROSCOPY BASIN (CUSTOM PROCEDURE TRAY) ×3 IMPLANT
PAD POSITIONER PINK NONSTERILE (MISCELLANEOUS) ×3 IMPLANT
POUCH SPECIMEN RETRIEVAL 10MM (ENDOMECHANICALS) IMPLANT
SEPRAFILM MEMBRANE 5X6 (MISCELLANEOUS) ×3 IMPLANT
SET IRRIG TUBING LAPAROSCOPIC (IRRIGATION / IRRIGATOR) IMPLANT
SLEEVE XCEL OPT CAN 5 100 (ENDOMECHANICALS) ×6 IMPLANT
SOLUTION ELECTROLUBE (MISCELLANEOUS) IMPLANT
STRIP CLOSURE SKIN 1/4X4 (GAUZE/BANDAGES/DRESSINGS) ×2 IMPLANT
SUT MNCRL AB 4-0 PS2 18 (SUTURE) ×3 IMPLANT
SUT MON AB 4-0 PS1 27 (SUTURE) ×3 IMPLANT
SUT VICRYL 0 UR6 27IN ABS (SUTURE) ×3 IMPLANT
SYR 5ML LL (SYRINGE) IMPLANT
TOWEL OR 17X24 6PK STRL BLUE (TOWEL DISPOSABLE) ×6 IMPLANT
TRAY FOLEY CATH SILVER 14FR (SET/KITS/TRAYS/PACK) ×3 IMPLANT
TROCAR XCEL NON-BLD 11X100MML (ENDOMECHANICALS) IMPLANT
TROCAR XCEL NON-BLD 5MMX100MML (ENDOMECHANICALS) ×3 IMPLANT
WARMER LAPAROSCOPE (MISCELLANEOUS) ×3 IMPLANT
WATER STERILE IRR 1000ML POUR (IV SOLUTION) ×3 IMPLANT

## 2014-07-29 NOTE — Anesthesia Procedure Notes (Signed)
Procedure Name: Intubation Date/Time: 07/29/2014 7:23 AM Performed by: Tobin Chad Pre-anesthesia Checklist: Patient identified, Emergency Drugs available, Timeout performed, Suction available and Patient being monitored Patient Re-evaluated:Patient Re-evaluated prior to inductionOxygen Delivery Method: Simple face mask Preoxygenation: Pre-oxygenation with 100% oxygen Intubation Type: IV induction Ventilation: Mask ventilation without difficulty Laryngoscope Size: Mac and 3 Grade View: Grade II Tube size: 7.0 mm Number of attempts: 1 Airway Equipment and Method: Patient positioned with wedge pillow Placement Confirmation: ETT inserted through vocal cords under direct vision,  positive ETCO2 and CO2 detector Secured at: 22 cm Tube secured with: Tape Dental Injury: Teeth and Oropharynx as per pre-operative assessment

## 2014-07-29 NOTE — Transfer of Care (Signed)
Immediate Anesthesia Transfer of Care Note  Patient: Ashley Francis  Procedure(s) Performed: Procedure(s): LAPAROSCOPY OPERATIVE (N/A) LAPAROSCOPIC LYSIS OF ADHESIONS (N/A)  Patient Location: PACU  Anesthesia Type:General  Level of Consciousness: awake, alert , oriented and patient cooperative  Airway & Oxygen Therapy: Patient Spontanous Breathing and Patient connected to nasal cannula oxygen  Post-op Assessment: Report given to RN and Post -op Vital signs reviewed and stable  Post vital signs: Reviewed and stable  Last Vitals:  Filed Vitals:   07/29/14 0615  BP: 108/84  Pulse: 64  Temp: 36.6 C  Resp: 20    Complications: No apparent anesthesia complications

## 2014-07-29 NOTE — Op Note (Signed)
OPERATIVE NOTE  Preop Diagnosis: PELVIC PAIN   Postop Diagnosis: PELVIC PAIN   Procedure: LAPAROSCOPY OPERATIVE   Anesthesia: General   Anesthesiologist: No responsible provider has been recorded for the case.   Attending: Sanjuana Kava, MD   Assistant: Jerelyn Charles MD  Findings: Laparoscpic findings: severe adhesions left adnexa to left sigmoid and pelvic sidewall. Left ovary hypertrophic but otherwise normal.  Left fallopian tube appeared normal after dissection of adhesions.  Right fallopian tube present and adherent to right side wall.  No visible right ovary.  Normal liver edge, appendix absent.  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  Normal appearing uterus, (hypervascularity).  No visible endometriosis.   Pathology: No Specimen  Fluids: 1536mL  UOP: 100CC OF clear urine  EBL: 749 mL  Complications: none  Procedure: The patient was taken to the operating room after the risks, benefits, alternatives, complications, treatment options, and expected outcomes were discussed with the patient. The patient verbalized understanding, the patient concurred with the proposed plan and consent signed and witnessed. The patient was taken to the Operating Room, identified as Ashley Francis and the procedure verified as operative laparoscopy. A Time Out was held and the above information confirmed.  After the attainment of adequate general anesthesia she was placed in the modified lithotomy position using Allen stirrups. Both upper extremities were padded and placed by her side. An examination under anesthesia was performed.  The abdomen was prepped with ChloraPrep. The perineum and vagina were prepped with multiple layers of Betadine.  The bladder was catheterized. The abdomen and perineum were draped as a sterile field. .  A Hulka tenaculum was placed into the endometrial canal and fixed to the anterior lip of the cervix.  The surgeon re-gloved.    A 5 mm  midline infra-umbilical incision was made after infiltration with 0.25% Marcaine. Using direct entry 52mm Excel Optiview port attached to the 0-degree 74mm operative laparoscope was entered into the tented up abdomen under direct video visualization.  Confirmation of placement was made with the laparoscope and pneumoperitoneum was made with approximately 2L C02 gas.  The patient was placed in steep Trendelenburg and a complete abdominal and pelvic survey was performed with findings noted above.Marcaine injected in the RLQ and a 5 mm incision was made and 5 mm trocar advanced into the intraabdominal cavity.  The same was done in the LLQ area. This was performed under direct video visualization. There was no noted injury with placement of any trochars.   Blunt dissection of adhesions on the left adnexa was performed using the [place] endoscopic graspers and blunt probe. Sharp dissection using the 74mm Ligasure was performed removing the adhesion from the left fallopian tube off the pelvic side wall and large intestines. There was a small amount of bleeding from the scar tissue around the Left Infundibulopelvic ligament which was alleviated by the ligasure. Copious irrigation was performed and the dissection site was noted to be hemostatic. The Left fallopian tube was freed from the sigmoid and the round ligament isolated restoring the anatomy to as close to normal as possible.  The pelvic side of the the left ovary was still adherent but it could not be freed safely.    All trochars were then removed from the peritoneal cavity under direct visualization as the CO2 was allowed to escape. All skin incisions were closed with Dermabond.   The uterine manipulator was removed as well as the Foley catheter. The patient was awakened from general  anesthesia and taken to the recovery room in satisfactory condition having tolerated the procedure well with sponge and instrument counts correct. It was anticipated that she  would be discharged home later that afternoon.  Ashley Francis STACIA

## 2014-07-29 NOTE — Anesthesia Postprocedure Evaluation (Signed)
  Anesthesia Post-op Note  Patient: Ashley Francis  Procedure(s) Performed: Procedure(s) (LRB): LAPAROSCOPY OPERATIVE (N/A) LAPAROSCOPIC LYSIS OF ADHESIONS (N/A)  Patient Location: PACU  Anesthesia Type: General  Level of Consciousness: awake and alert   Airway and Oxygen Therapy: Patient Spontanous Breathing  Post-op Pain: mild  Post-op Assessment: Post-op Vital signs reviewed, Patient's Cardiovascular Status Stable, Respiratory Function Stable, Patent Airway and No signs of Nausea or vomiting  Last Vitals:  Filed Vitals:   07/29/14 0930  BP: 113/68  Pulse: 63  Temp:   Resp: 25    Post-op Vital Signs: stable   Complications: No apparent anesthesia complications

## 2014-07-29 NOTE — H&P (Addendum)
HISTORY AND PHYSICAL  Subjective: Patient is a 29 y.o. gravida 1 P1 was a consultation from Dr. Cristi Loron to discuss laparoscopy for lower abdominal pain. She notes chronic pelvic pain, characterized as a throbbing "in the area of her left ovary", non-radiating. She denies associated nausea or vomiting. She notes that her pelvic pain has been present for many years. Her h/o is significant for ruptured appendix at the age of 25 for which she had an open McBurney laparotomy. She notes that a right oopherectomy was performed inadvertently at the time of the procedure. 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 Her right tube was abnormal and was removed during this surgery. She has her left adnexa. She desires future fertility. She reports severe dysmenorrhea, +dyspareunia. She denies dyschezia or urinary or bowel dysfunction. She has tried continuous OCPs in the past and now has the Mirena IUD in place.   She does desire further childbearing.  Pertinent Gyn History:  Menses: with severe dysmenorrhea Bleeding: normal Contraception: IUD DES exposure: denies Blood transfusions: none STDs: no past history Preventive screening:  Last mammogram: n/a Last pap: normal   There are no active problems to display for this patient.  Past Medical History  Diagnosis Date  . UTI (lower urinary tract infection)   . Ovarian cyst rupture   . Cervical cancer   . Anxiety   . Depression   . SVD (spontaneous vaginal delivery)     x 1  . Bronchitis     dx 06/2014 - tx with albuterol inhaler  . Kidney stone     passed  . GERD (gastroesophageal reflux disease)   . Headache     otc med prn    Past Surgical History  Procedure Laterality Date  . Diagnostic laparoscopy    . Wisdom tooth extraction    . Appendectomy    . Cryotherapy      Prescriptions prior to admission  Medication Sig  Dispense Refill Last Dose  . albuterol (PROVENTIL HFA;VENTOLIN HFA) 108 (90 BASE) MCG/ACT inhaler Inhale 2 puffs into the lungs every 6 (six) hours as needed for wheezing or shortness of breath.   07/29/2014 at 0540  . B Complex Vitamins (B COMPLEX PO) Take 1 tablet by mouth daily.   Past Month at Unknown time  . clonazePAM (KLONOPIN) 1 MG tablet Take 1 mg by mouth daily as needed for anxiety.   Past Week at Unknown time  . FLUoxetine (PROZAC) 20 MG tablet Take 40 mg by mouth at bedtime.    07/28/2014 at 2200  . ibuprofen (ADVIL,MOTRIN) 600 MG tablet Take 1 tablet (600 mg total) by mouth every 6 (six) hours as needed for fever or moderate pain. 30 tablet 0 Past Week at Unknown time  . ondansetron (ZOFRAN ODT) 4 MG disintegrating tablet Take 1 tablet (4 mg total) by mouth every 4 (four) hours as needed for nausea or vomiting. 20 tablet 0 Past Week at Unknown time  . ranitidine (ZANTAC) 75 MG tablet Take 75 mg by mouth daily as needed for heartburn.   07/29/2014 at 0540  . traMADol (ULTRAM) 50 MG tablet Take 2 tablets (100 mg total) by mouth every 6 (six) hours as needed. (Patient taking differently: Take 100 mg by mouth every 6 (six) hours as needed for moderate pain. ) 20 tablet 0 07/28/2014 at Unknown time  . azithromycin [facility]  Z-PAK) 250 MG tablet Take 2 pills today, then 1 pill daily until gone. (Patient not taking: Reported on 07/15/2014) 6 tablet 0   . benzonatate (TESSALON) 200 MG capsule Take 1 capsule (200 mg total) by mouth 2 (two) times daily as needed for cough. (Patient not taking: Reported on 07/15/2014) 30 capsule 0 Unknown at Unknown time  . levonorgestrel (MIRENA) 20 MCG/24HR IUD 1 each by Intrauterine route once.   06/06/2014 at Unknown time  . predniSONE (DELTASONE) 50 MG tablet Take 1 pill daily for 5 days. (Patient not taking: Reported on 07/15/2014) 5 tablet 0    Allergies  Allergen Reactions  . Clindamycin/Lincomycin Rash    History  Substance Use Topics  . Smoking status:  Current Every Day Smoker -- 0.10 packs/day for 10 years    Types: Cigarettes  . Smokeless tobacco: Never Used  . Alcohol Use: Yes     Comment: occasional    History reviewed. No pertinent family history.  Review of Systems Pertinent items are noted in HPI.   Objective: Vital signs in last 24 hours: Temp:  [97.9 F (36.6 C)] 97.9 F (36.6 C) (05/13 0615) Pulse Rate:  [64] 64 (05/13 0615) Resp:  [20] 20 (05/13 0615) BP: (108)/(84) 108/84 mmHg (05/13 0615) SpO2:  [100 %] 100 % (05/13 0615)  No exam performed today, deferred to OR.    Assessment/Plan: Patient with long history of chronic pelvic pain, dysmenorrhea, and dyspareunia. We discussed the possibility of endometriosis.  We discussed that endometriosis is defined as the presence of endometrial glands and stroma at extra uterine sites. These ectopic endometrial implants are usually located in the pelvis, but can occur nearly anywhere in the body. Endometriosis is a common, benign, chronic, estrogen-dependent disorder. It can be associated with many distressing and debilitating symptoms, such as pelvic pain, severe dysmenorrhea, dyspareunia and infertility, or it may be asymptomatic, and incidentally discovered at laparoscopy or exploratory surgery. We are completely unsure of the etiology of endometriosis however there is the implantation theory proposes that endometrial cells shed into the uterus during menstruation are transported through the fallopian tubes (referred to as retrograde menstruation), thereby gaining access to, and implanting on, pelvic structures.  We also discussed that a wide variety of disorders share one or more of the clinical features of endometriosis:  ?Pelvic pain may be caused by many conditions, including: ectopic pregnancy, pelvic inflammatory disease, interstitial cystitis, adenomyosis, ovarian neoplasms, pelvic adhesions, irritable bowel syndrome, colon cancer, diverticular disease  ?Dysmenorrhea may be  caused by a few conditions:  .Uterine adenomyosis is closely related to endometriosis, but consists of endometrial glands and stroma in the myometrium  .Another major etiology of dysmenorrhea is primary dysmenorrhea. Women with this condition often have a history of painful menses since menarche  .Uterine leiomyomas are associated with dysmenorrhea in some women. This is likely due to heavy menstrual flow and the passage of blood clots through the cervix  ?The location and character of the pain in dyspareunia helps to determine the etiology. Endometriosis is characterized by deep dyspareunia   We discussed management of her pelvic pain and the possibility of endometriosis. We discussed empiric medical therapy with GnRH agonists may be used in women with pelvic pain and suspected endometriosis, prior to establishing the diagnosis surgically. This may provide sufficient pain relief and avoid a laparoscopy. There is insufficient long-term follow-up of patients managed initially with medical therapy to assess symptom recurrence or the eventual need for surgical intervention.   Patient was counseled  a diagnostic laparoscopy can be performed to establish the presence of endometriosis which provides an opportunity for ablation or excision of implants and adhesions, thus potentially preventing or delaying disease or symptom progression. This approach is considered in patients with suspected advanced stages of disease (i.e., endometriomas), severe pain, or refractory pain to conservative pain management. She has one ovary left and desires fertility we discussed the fact that if there is an endometrioma, we would not risk losing her ovary by performing a cystectomy. We also discussed that she may have no evidence or overt gross findings during the case to help elucidate why she is in pain.   We discussed at length the risks of the procedure to include injury to bowel, blood vessels or other major organs possibly  requiring conversion to an open procedure.  Other risks include hemorrhage, infection and poor wound healing. Patient voiced understanding and desires to go forth with the procedure.   WSP

## 2014-07-29 NOTE — Discharge Instructions (Signed)

## 2014-08-01 ENCOUNTER — Inpatient Hospital Stay (HOSPITAL_COMMUNITY): Payer: Medicaid Other

## 2014-08-01 ENCOUNTER — Encounter (HOSPITAL_COMMUNITY): Payer: Self-pay | Admitting: Obstetrics & Gynecology

## 2014-08-01 ENCOUNTER — Inpatient Hospital Stay (HOSPITAL_COMMUNITY)
Admission: AD | Admit: 2014-08-01 | Discharge: 2014-08-01 | Disposition: A | Discharge status: Home / Self Care | Payer: Medicaid Other | Source: Ambulatory Visit | Attending: Obstetrics and Gynecology | Admitting: Obstetrics and Gynecology

## 2014-08-01 DIAGNOSIS — G8918 Other acute postprocedural pain: Secondary | ICD-10-CM | POA: Diagnosis not present

## 2014-08-01 DIAGNOSIS — K5900 Constipation, unspecified: Secondary | ICD-10-CM | POA: Insufficient documentation

## 2014-08-01 DIAGNOSIS — R1030 Lower abdominal pain, unspecified: Secondary | ICD-10-CM | POA: Diagnosis not present

## 2014-08-01 DIAGNOSIS — M25512 Pain in left shoulder: Secondary | ICD-10-CM | POA: Diagnosis not present

## 2014-08-01 DIAGNOSIS — M25511 Pain in right shoulder: Secondary | ICD-10-CM | POA: Diagnosis not present

## 2014-08-01 LAB — CBC WITH DIFFERENTIAL/PLATELET
BASOS PCT: 1 % (ref 0–1)
Basophils Absolute: 0.1 10*3/uL (ref 0.0–0.1)
Eosinophils Absolute: 0.6 10*3/uL (ref 0.0–0.7)
Eosinophils Relative: 6 % — ABNORMAL HIGH (ref 0–5)
HEMATOCRIT: 40.5 % (ref 36.0–46.0)
Hemoglobin: 14.4 g/dL (ref 12.0–15.0)
Lymphocytes Relative: 26 % (ref 12–46)
Lymphs Abs: 2.5 10*3/uL (ref 0.7–4.0)
MCH: 32.4 pg (ref 26.0–34.0)
MCHC: 35.6 g/dL (ref 30.0–36.0)
MCV: 91 fL (ref 78.0–100.0)
Monocytes Absolute: 0.7 10*3/uL (ref 0.1–1.0)
Monocytes Relative: 7 % (ref 3–12)
Neutro Abs: 5.8 10*3/uL (ref 1.7–7.7)
Neutrophils Relative %: 60 % (ref 43–77)
PLATELETS: 277 10*3/uL (ref 150–400)
RBC: 4.45 MIL/uL (ref 3.87–5.11)
RDW: 12.4 % (ref 11.5–15.5)
WBC: 9.7 10*3/uL (ref 4.0–10.5)

## 2014-08-01 MED ORDER — IOHEXOL 300 MG/ML  SOLN
50.0000 mL | INTRAMUSCULAR | Status: AC
Start: 2014-08-01 — End: 2014-08-01
  Administered 2014-08-01 (×2): 50 mL via ORAL

## 2014-08-01 MED ORDER — IOHEXOL 300 MG/ML  SOLN
100.0000 mL | Freq: Once | INTRAMUSCULAR | Status: AC | PRN
  Administered 2014-08-01: 100 mL via INTRAVENOUS

## 2014-08-01 MED ORDER — HYDROMORPHONE HCL 1 MG/ML IJ SOLN
1.0000 mg | Freq: Once | INTRAMUSCULAR | Status: AC
Start: 2014-08-01 — End: 2014-08-01
  Administered 2014-08-01: 1 mg via INTRAVENOUS
  Filled 2014-08-01: qty 1

## 2014-08-01 NOTE — MAU Note (Signed)
Pt had a exploritory laparoscopy  On Friday. Pt still c/o pain . Pain medication is not helping. C/O nausea vomited yesterday.

## 2014-08-01 NOTE — Discharge Instructions (Signed)

## 2014-08-01 NOTE — MAU Provider Note (Signed)
History     CSN: [id number]  Arrival date and time: 08/01/14 1544   First Provider Initiated Contact with Patient 08/01/14 1637      Chief Complaint  Patient presents with  . Post-op Problem   HPI [name] is a 29 y.o. who presents to MAU today with complaint of post operative pain. The patient had exploratory laparoscopy of pelvic pain on 07/29/14. She states continued lower abdominal pain bilaterally and bilateral shoulder pain since surgery that has been worse today. She is taking percocet and Ibuprofen without relief. She rates her pain at 9/10 now. She denies fever.   OB History    No data available      Past Medical History  Diagnosis Date  . UTI (lower urinary tract infection)   . Ovarian cyst rupture   . Cervical cancer   . Anxiety   . Depression   . SVD (spontaneous vaginal delivery)     x 1  . Bronchitis     dx 06/2014 - tx with albuterol inhaler  . Kidney stone     passed  . GERD (gastroesophageal reflux disease)   . Headache     otc med prn    Past Surgical History  Procedure Laterality Date  . Diagnostic laparoscopy    . Wisdom tooth extraction    . Appendectomy    . Cryotherapy    . Laparoscopy N/A 07/29/2014    Procedure: LAPAROSCOPY OPERATIVE;  Surgeon: Sanjuana Kava, MD;  Location: [facility];  Service: Gynecology;  Laterality: N/A;  . Laparoscopic lysis of adhesions N/A 07/29/2014    Procedure: LAPAROSCOPIC LYSIS OF ADHESIONS;  Surgeon: Sanjuana Kava, MD;  Location: [facility];  Service: Gynecology;  Laterality: N/A;    No family history on file.  History  Substance Use Topics  . Smoking status: Current Every Day Smoker -- 0.10 packs/day for 10 years    Types: Cigarettes  . Smokeless tobacco: Never Used  . Alcohol Use: Yes     Comment: occasional    Allergies:  Allergies  Allergen Reactions  . Clindamycin/Lincomycin Rash    Prescriptions prior to admission  Medication Sig Dispense Refill Last Dose  . albuterol (PROVENTIL  HFA;VENTOLIN HFA) 108 (90 BASE) MCG/ACT inhaler Inhale 2 puffs into the lungs every 6 (six) hours as needed for wheezing or shortness of breath.   08/01/2014 at Unknown time  . clonazePAM (KLONOPIN) 1 MG tablet Take 1 mg by mouth daily as needed for anxiety.   Past Week at Unknown time  . FLUoxetine (PROZAC) 20 MG tablet Take 40 mg by mouth at bedtime.    08/01/2014 at Unknown time  . ibuprofen (ADVIL,MOTRIN) 600 MG tablet Take 1 tablet (600 mg total) by mouth every 6 (six) hours as needed for fever or moderate pain. 60 tablet 3 07/31/2014 at Unknown time  . Liniments (SALONPAS EX) Apply 1 application topically as needed (shoulder pain).   08/01/2014 at Unknown time  . oxyCODONE-acetaminophen (ROXICET) 5-325 MG per tablet Take 1-2 tablets by mouth every 4 (four) hours as needed for severe pain. 60 tablet 0 08/01/2014 at Unknown time  . ranitidine (ZANTAC) 75 MG tablet Take 75 mg by mouth daily as needed for heartburn.   Past Week at Unknown time  . levonorgestrel (MIRENA) 20 MCG/24HR IUD 1 each by Intrauterine route once.   06/06/2014 at Unknown time  . ondansetron (ZOFRAN ODT) 4 MG disintegrating tablet Take 1 tablet (4 mg total) by mouth every 4 (four) hours  as needed for nausea or vomiting. (Patient not taking: Reported on 08/01/2014) 20 tablet 0 Not Taking at Unknown time    Review of Systems  Constitutional: Negative for fever and malaise/fatigue.  Gastrointestinal: Positive for abdominal pain and constipation. Negative for nausea, vomiting and diarrhea.   Physical Exam   Blood pressure 112/76, pulse 80, temperature 98.3 F (36.8 C), resp. rate 18, height 5\' 3"  (1.6 m), weight 215 lb 9.6 oz (97.796 kg), last menstrual period 07/10/2014, SpO2 98 %.  Physical Exam  Nursing note and vitals reviewed. Constitutional: She is oriented to person, place, and time. She appears well-developed and well-nourished. No distress.  HENT:  Head: Normocephalic and atraumatic.  Cardiovascular: Normal rate.    Respiratory: Effort normal.  GI: Soft. She exhibits no distension and no mass. There is no tenderness. There is no rebound and no guarding.  Neurological: She is alert and oriented to person, place, and time.  Skin: Skin is warm and dry. No erythema.  Psychiatric: She has a normal mood and affect.   Results for orders placed or performed during the hospital encounter of 08/01/14 (from the past 24 hour(s))  CBC with Differential/Platelet     Status: Abnormal   Collection Time: 08/01/14  8:02 PM  Result Value Ref Range   WBC 9.7 4.0 - 10.5 K/uL   RBC 4.45 3.87 - 5.11 MIL/uL   Hemoglobin 14.4 12.0 - 15.0 g/dL   HCT 40.5 36.0 - 46.0 %   MCV 91.0 78.0 - 100.0 fL   MCH 32.4 26.0 - 34.0 pg   MCHC 35.6 30.0 - 36.0 g/dL   RDW 12.4 11.5 - 15.5 %   Platelets 277 150 - 400 K/uL   Neutrophils Relative % 60 43 - 77 %   Neutro Abs 5.8 1.7 - 7.7 K/uL   Lymphocytes Relative 26 12 - 46 %   Lymphs Abs 2.5 0.7 - 4.0 K/uL   Monocytes Relative 7 3 - 12 %   Monocytes Absolute 0.7 0.1 - 1.0 K/uL   Eosinophils Relative 6 (H) 0 - 5 %   Eosinophils Absolute 0.6 0.0 - 0.7 K/uL   Basophils Relative 1 0 - 1 %   Basophils Absolute 0.1 0.0 - 0.1 K/uL   Ct Abdomen Pelvis W Contrast  08/01/2014   CLINICAL DATA:  Persistent lower pelvic pain after exploratory laparoscopy. Nausea and vomiting. Initial encounter.  EXAM: CT ABDOMEN AND PELVIS WITH CONTRAST  TECHNIQUE: Multidetector CT imaging of the abdomen and pelvis was performed using the standard protocol following bolus administration of intravenous contrast.  CONTRAST:  142mL OMNIPAQUE IOHEXOL 300 MG/ML  SOLN  COMPARISON:  CT of the abdomen and pelvis performed 04/17/2013, and pelvic ultrasound performed 05/14/2013  FINDINGS: The visualized lung bases are clear.  The liver and spleen are unremarkable in appearance. The gallbladder is within normal limits. The pancreas and adrenal glands are unremarkable.  The kidneys are unremarkable in appearance. There is no  evidence of hydronephrosis. No renal or ureteral stones are seen. No perinephric stranding is appreciated.  No free fluid is identified. The small bowel is unremarkable in appearance. The stomach is within normal limits. No acute vascular abnormalities are seen.  The patient is status post appendectomy. Contrast progresses to the level of the ascending colon. The colon is partially filled with stool and is unremarkable in appearance.  The bladder is mildly distended and grossly unremarkable. The uterus is unremarkable in appearance. An intrauterine device is noted in expected position at the  fundus of the uterus. There is somewhat asymmetric prominence of the left ovary, with mild surrounding soft tissue stranding, more prominent than in 2015. This might reflect a small ruptured cyst. No inguinal lymphadenopathy is seen.  No acute osseous abnormalities are identified.  IMPRESSION: 1. Mild asymmetric prominence of the left ovary again noted. Mild surrounding soft tissue stranding is more prominent than in 2015. This might reflect a small ruptured cyst. 2. Otherwise unremarkable contrast-enhanced CT of the abdomen and pelvis. 3.   Electronically Signed   By: Garald Balding M.D.   On: 08/01/2014 19:13    MAU Course  Procedures None  MDM Dr. Alwyn Pea called ahead and stated that patient needed CT scan of abdomen and pelvis CT scan ordered upon patient arrival in MAU. Radiology confirms CT scan with contrast is needed Discussed patient with Dr. Ouida Sills. He requests CBC to check WBCs. If WNL patient may be discharged to follow-up in the office.  Assessment and Plan  A: Post operative pain Constipation  P: Discharge home Patient advised to continue Percocet, Ibuprofen PRN for pain Patient encouraged to try Colace and increased fiber intake and hydration as tolerated Warning signs for worsening condition discussed Patient advised to follow-up with Dr. Alwyn Pea as scheduled or sooner PRN Patient may return to  MAU as needed or if her condition were to change or worsen   Luvenia Redden, PA-C  08/01/2014, 8:12 PM

## 2015-01-01 ENCOUNTER — Emergency Department (HOSPITAL_COMMUNITY): Payer: Medicaid Other

## 2015-01-01 ENCOUNTER — Emergency Department (HOSPITAL_COMMUNITY)
Admission: EM | Admit: 2015-01-01 | Discharge: 2015-01-01 | Disposition: A | Discharge status: Home / Self Care | Payer: Medicaid Other | Attending: Emergency Medicine | Admitting: Emergency Medicine

## 2015-01-01 ENCOUNTER — Encounter (HOSPITAL_COMMUNITY): Payer: Self-pay | Admitting: Emergency Medicine

## 2015-01-01 DIAGNOSIS — Z87440 Personal history of urinary (tract) infections: Secondary | ICD-10-CM | POA: Insufficient documentation

## 2015-01-01 DIAGNOSIS — Z793 Long term (current) use of hormonal contraceptives: Secondary | ICD-10-CM | POA: Diagnosis not present

## 2015-01-01 DIAGNOSIS — F419 Anxiety disorder, unspecified: Secondary | ICD-10-CM | POA: Insufficient documentation

## 2015-01-01 DIAGNOSIS — Z87442 Personal history of urinary calculi: Secondary | ICD-10-CM | POA: Insufficient documentation

## 2015-01-01 DIAGNOSIS — Z8742 Personal history of other diseases of the female genital tract: Secondary | ICD-10-CM | POA: Diagnosis not present

## 2015-01-01 DIAGNOSIS — Z79899 Other long term (current) drug therapy: Secondary | ICD-10-CM | POA: Insufficient documentation

## 2015-01-01 DIAGNOSIS — Z720 Tobacco use: Secondary | ICD-10-CM | POA: Insufficient documentation

## 2015-01-01 DIAGNOSIS — K219 Gastro-esophageal reflux disease without esophagitis: Secondary | ICD-10-CM | POA: Insufficient documentation

## 2015-01-01 DIAGNOSIS — Z792 Long term (current) use of antibiotics: Secondary | ICD-10-CM | POA: Diagnosis not present

## 2015-01-01 DIAGNOSIS — H9192 Unspecified hearing loss, left ear: Secondary | ICD-10-CM | POA: Diagnosis present

## 2015-01-01 DIAGNOSIS — Z8709 Personal history of other diseases of the respiratory system: Secondary | ICD-10-CM | POA: Insufficient documentation

## 2015-01-01 DIAGNOSIS — T1490XA Injury, unspecified, initial encounter: Secondary | ICD-10-CM

## 2015-01-01 DIAGNOSIS — S0990XA Unspecified injury of head, initial encounter: Secondary | ICD-10-CM | POA: Diagnosis not present

## 2015-01-01 DIAGNOSIS — F329 Major depressive disorder, single episode, unspecified: Secondary | ICD-10-CM | POA: Diagnosis not present

## 2015-01-01 DIAGNOSIS — H7292 Unspecified perforation of tympanic membrane, left ear: Secondary | ICD-10-CM | POA: Diagnosis not present

## 2015-01-01 DIAGNOSIS — Y939 Activity, unspecified: Secondary | ICD-10-CM | POA: Diagnosis not present

## 2015-01-01 DIAGNOSIS — Y929 Unspecified place or not applicable: Secondary | ICD-10-CM | POA: Diagnosis not present

## 2015-01-01 DIAGNOSIS — Y999 Unspecified external cause status: Secondary | ICD-10-CM | POA: Insufficient documentation

## 2015-01-01 MED ORDER — OFLOXACIN 0.3 % OT SOLN: 5.0000 [drp] | Freq: Two times a day (BID) | OTIC | Status: DC

## 2015-01-01 MED ORDER — HYDROCODONE-ACETAMINOPHEN 5-325 MG PO TABS: 1.0000 | ORAL_TABLET | Freq: Four times a day (QID) | ORAL | Status: DC | PRN

## 2015-01-01 MED ORDER — IBUPROFEN 800 MG PO TABS
800.0000 mg | ORAL_TABLET | Freq: Once | ORAL | Status: AC
  Administered 2015-01-01: 800 mg via ORAL
  Filled 2015-01-01: qty 2

## 2015-01-01 MED ORDER — ONDANSETRON 4 MG PO TBDP
4.0000 mg | ORAL_TABLET | Freq: Once | ORAL | Status: AC
  Administered 2015-01-01: 4 mg via ORAL
  Filled 2015-01-01: qty 1

## 2015-01-01 MED ORDER — HYDROCODONE-ACETAMINOPHEN 5-325 MG PO TABS
1.0000 | ORAL_TABLET | Freq: Once | ORAL | Status: AC
Start: 2015-01-01 — End: 2015-01-01
  Administered 2015-01-01: 1 via ORAL
  Filled 2015-01-01: qty 1

## 2015-01-01 NOTE — ED Notes (Signed)
Pt returned from CT °
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
## 2016-04-08 IMAGING — CT CT ABD-PELV W/ CM
2 of 4 series · 17 of 46 positions shown, 19 images · IV contrast (OMNIPAQUE)
Comparison: CT of the abdomen and pelvis performed 04/17/2013, and
pelvic ultrasound performed 05/14/2013

CLINICAL DATA: Persistent lower pelvic pain after exploratory
laparoscopy. Nausea and vomiting. Initial encounter.

EXAM:
CT ABDOMEN AND PELVIS WITH CONTRAST
TECHNIQUE: Multidetector CT imaging of the abdomen and pelvis was performed
using the standard protocol following bolus administration of
intravenous contrast.
CONTRAST:  100mL OMNIPAQUE IOHEXOL 300 MG/ML  SOLN

[Series 2: routine abdomen/pelvis with · axial · 0.74mm/px · z∈[-501,-81]mm · 14 of 92 slices shown, 16 images]
[im 4/92  soft-tissue]
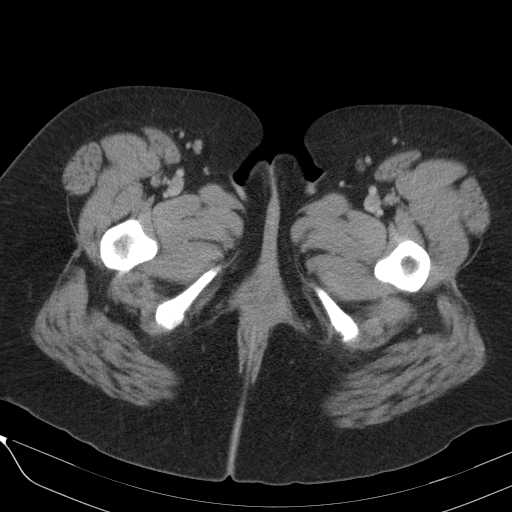
[im 4/92  bone]
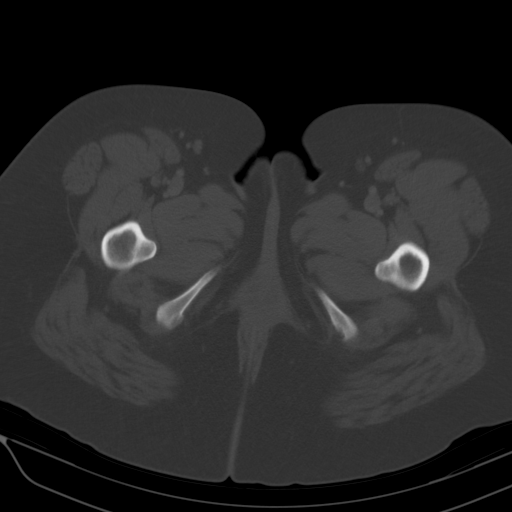
[im 12/92  soft-tissue]
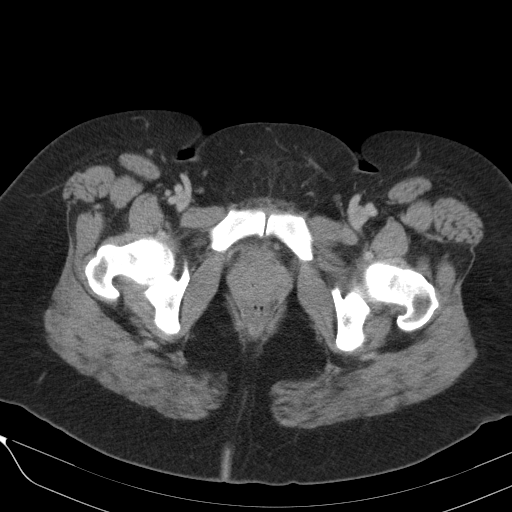
[im 19/92  soft-tissue]
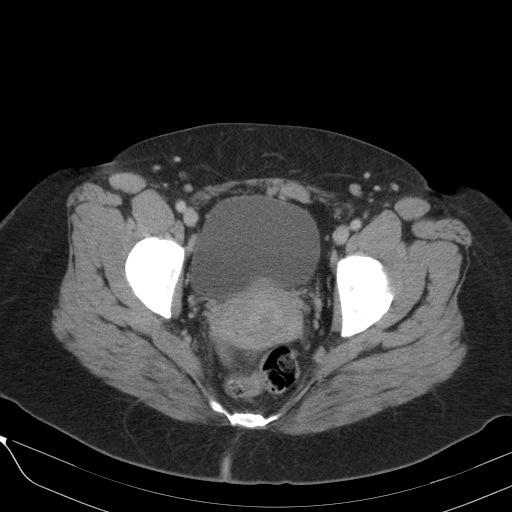
[im 23/92  soft-tissue]
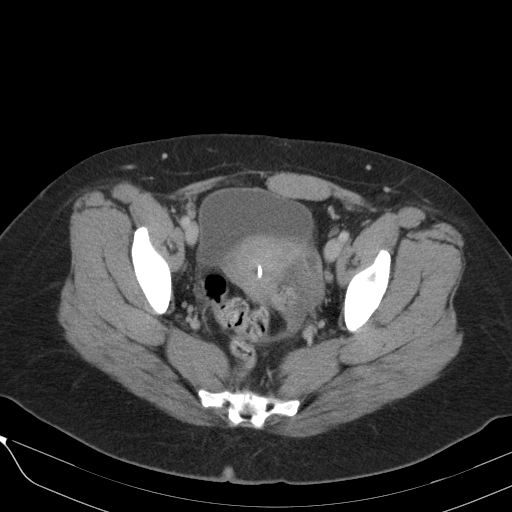
[im 31/92  soft-tissue]
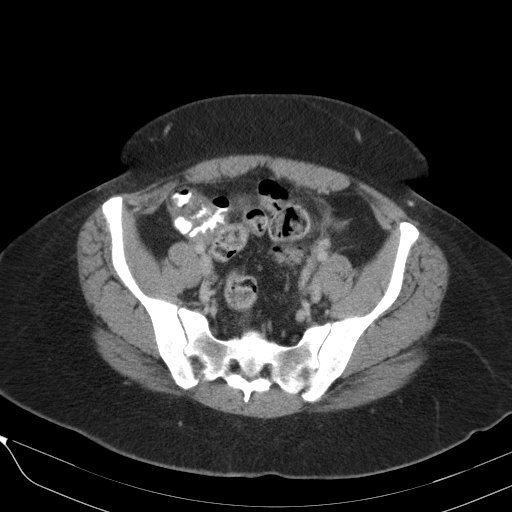
[im 38/92  soft-tissue]
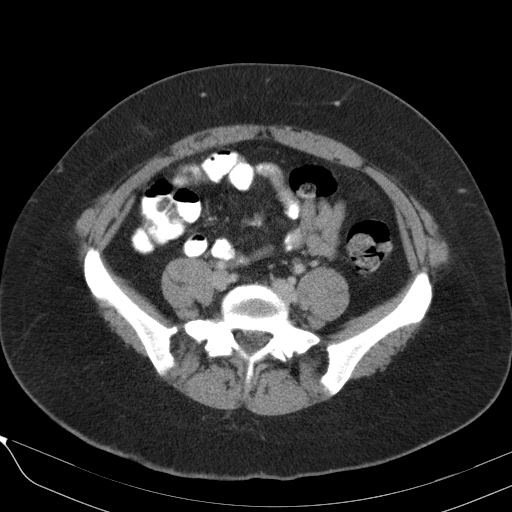
[im 42/92  soft-tissue]
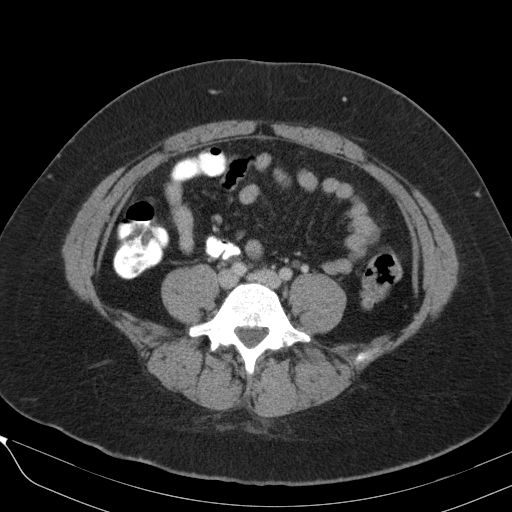
[im 50/92  soft-tissue]
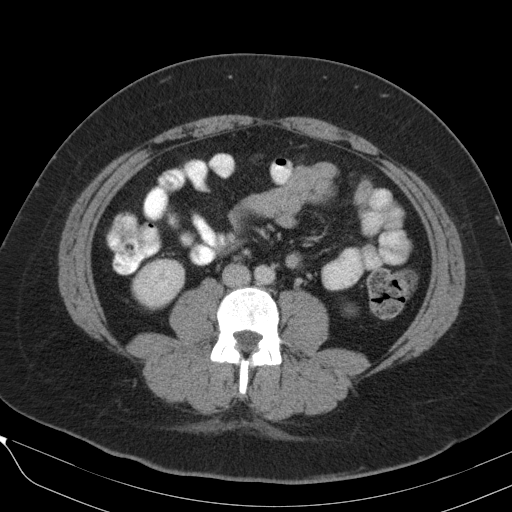
[im 54/92  soft-tissue]
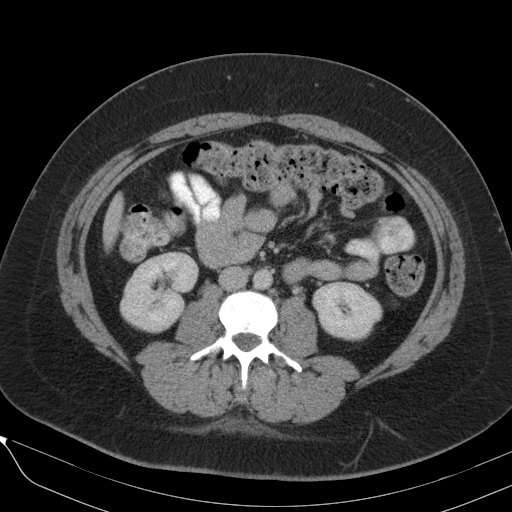
[im 54/92  bone]
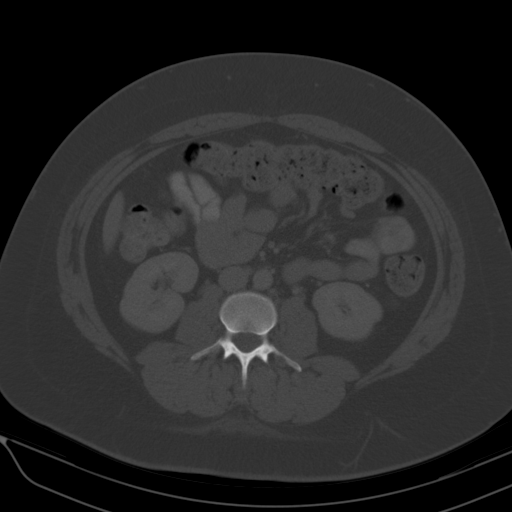
[im 61/92  soft-tissue]
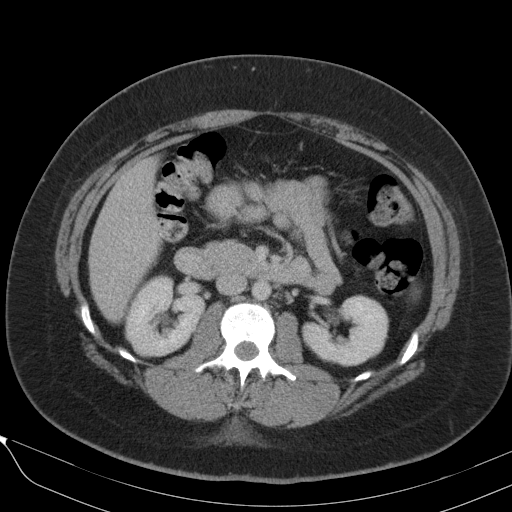
[im 69/92  soft-tissue]
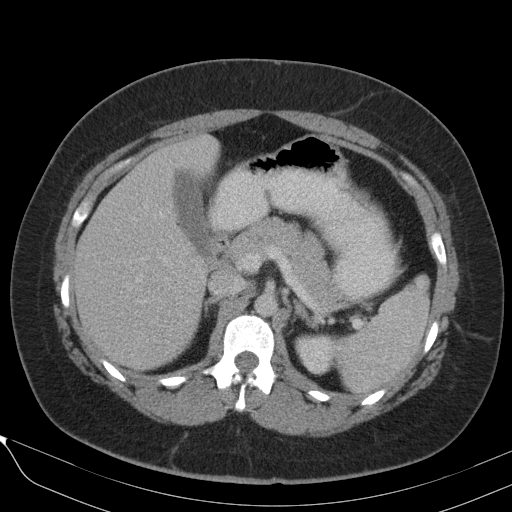
[im 73/92  soft-tissue]
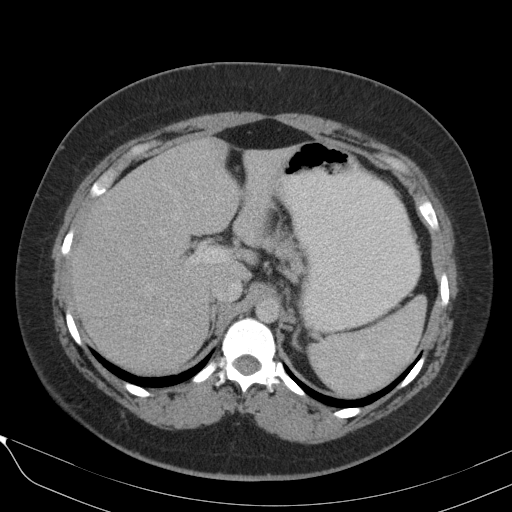
[im 80/92  soft-tissue]
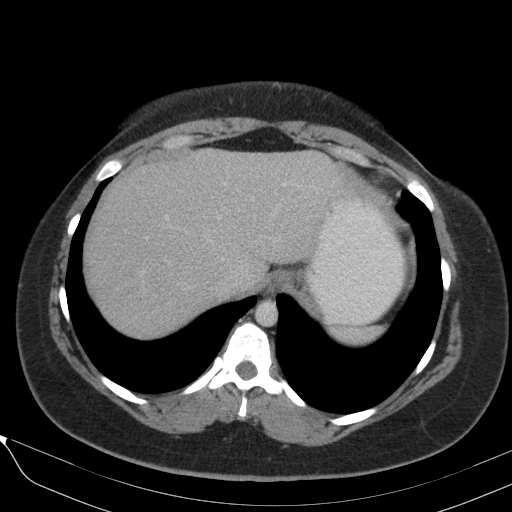
[im 88/92  soft-tissue]
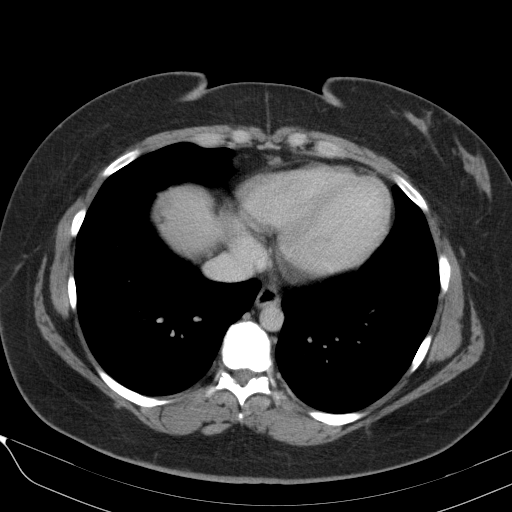

[Series 602: <mpr thick range> · coronal · 0.90mm/px · 3 of 164 slices shown]
[im 55/164  soft-tissue]
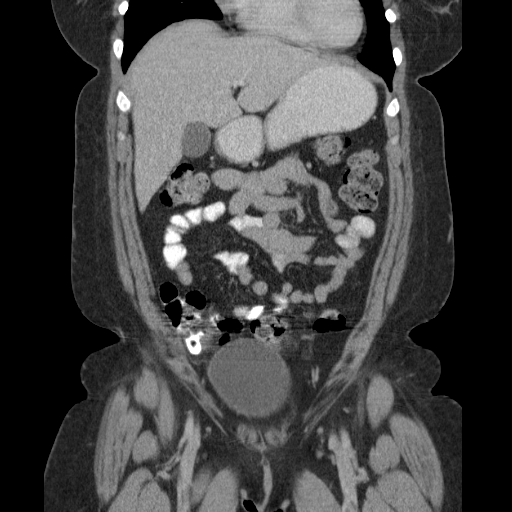
[im 73/164  soft-tissue]
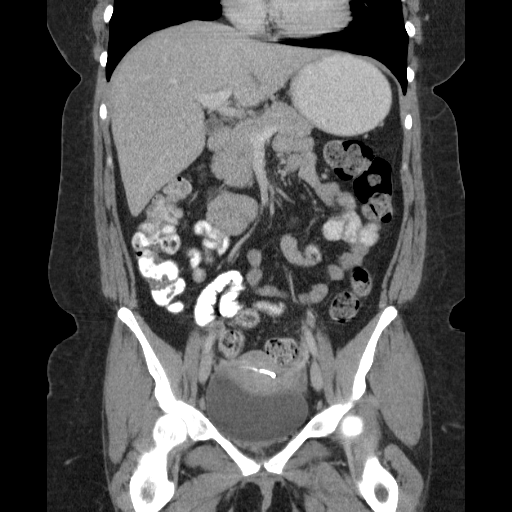
[im 91/164  soft-tissue]
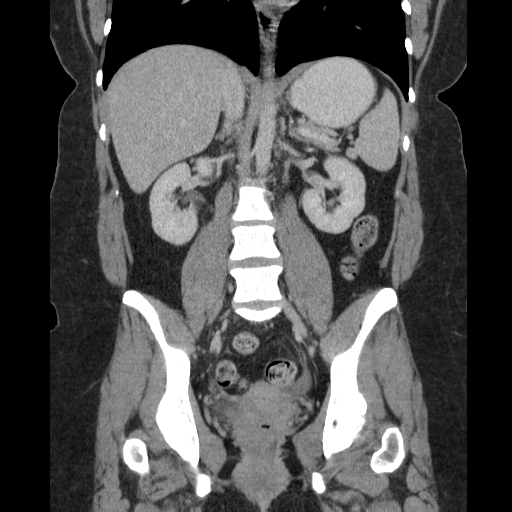

[17 of 46 positions shown; findings below may reference images not displayed]

FINDINGS: The visualized lung bases are clear.

The liver and spleen are unremarkable in appearance. The gallbladder
is within normal limits. The pancreas and adrenal glands are
unremarkable.

The kidneys are unremarkable in appearance. There is no evidence of
hydronephrosis. No renal or ureteral stones are seen. No perinephric
stranding is appreciated.

No free fluid is identified. The small bowel is unremarkable in
appearance. The stomach is within normal limits. No acute vascular
abnormalities are seen.

The patient is status post appendectomy. Contrast progresses to the
level of the ascending colon. The colon is partially filled with
stool and is unremarkable in appearance.

The bladder is mildly distended and grossly unremarkable. The uterus
is unremarkable in appearance. An intrauterine device is noted in
expected position at the fundus of the uterus. There is somewhat
asymmetric prominence of the left ovary, with mild surrounding soft
tissue stranding, more prominent than in 2143. This might reflect a
small ruptured cyst. No inguinal lymphadenopathy is seen.

No acute osseous abnormalities are identified.
IMPRESSION: 1. Mild asymmetric prominence of the left ovary again noted. Mild
surrounding soft tissue stranding is more prominent than in 2143.
This might reflect a small ruptured cyst.
2. Otherwise unremarkable contrast-enhanced CT of the abdomen and
pelvis.
[DATE].

## 2016-07-14 ENCOUNTER — Ambulatory Visit (HOSPITAL_COMMUNITY)
Admission: EM | Admit: 2016-07-14 | Discharge: 2016-07-14 | Disposition: A | Discharge status: Home / Self Care | Payer: Medicaid Other | Attending: Internal Medicine | Admitting: Internal Medicine

## 2016-07-14 ENCOUNTER — Encounter (HOSPITAL_COMMUNITY): Payer: Self-pay | Admitting: Emergency Medicine

## 2016-07-14 DIAGNOSIS — K047 Periapical abscess without sinus: Secondary | ICD-10-CM

## 2016-07-14 MED ORDER — ONDANSETRON 4 MG PO TBDP: 4.0000 mg | ORAL_TABLET | Freq: Three times a day (TID) | ORAL | 0 refills | Status: AC | PRN

## 2016-07-14 MED ORDER — HYDROCODONE-ACETAMINOPHEN 5-325 MG PO TABS: 1.0000 | ORAL_TABLET | Freq: Four times a day (QID) | ORAL | 0 refills | Status: AC | PRN

## 2016-07-14 MED ORDER — PENICILLIN V POTASSIUM 500 MG PO TABS: 500.0000 mg | ORAL_TABLET | Freq: Four times a day (QID) | ORAL | 0 refills | Status: AC

## 2016-07-14 NOTE — Discharge Instructions (Signed)
For your dental abscess, I prescribed penicillin, take one tablet 4 times a day. I have also prescribed a medicine for pain called hydrocodone, this medicine is a narcotic, it will cause drowsiness, and it is addictive. Do not take more than what is necessary, do not drink alcohol while taking, and do not operate any heavy machinery while taking this medicine. Contact your dentist in the morning, as he will need to follow-up with the dentist for further care as your condition.

## 2016-07-14 NOTE — ED Provider Notes (Signed)
CSN: [id number]     Arrival date & time 07/14/16  1901 History   First MD Initiated Contact with Patient 07/14/16 2008     Chief Complaint  Patient presents with  . Dental Problem   (Consider location/radiation/quality/duration/timing/severity/associated sxs/prior Treatment) 31 year old female presents to clinic with chief complaint of dental abscess. Been ongoing for 1 week, worsening the last 24 hours. Denies any fever, nausea, or other symptoms   The history is provided by the patient.  Dental Pain  Location:  Lower Lower teeth location:  25/RL central incisor and 26/RL lateral incisor Quality:  Aching, pulsating and pressure-like Severity:  Severe Onset quality:  Gradual Duration:  1 day Timing:  Constant Progression:  Worsening Chronicity:  New Context: abscess   Previous work-up:  Root canal Relieved by:  Nothing Worsened by:  Cold food/drink, hot food/drink, touching and pressure Ineffective treatments:  Acetaminophen, NSAIDs and topical anesthetic gel Associated symptoms: no congestion, no difficulty swallowing, no drooling, no facial pain, no facial swelling, no fever, no neck pain and no neck swelling   Risk factors: sufficient dental care     Past Medical History:  Diagnosis Date  . Anxiety   . Bronchitis    dx 06/2014 - tx with albuterol inhaler  . Cervical cancer ([place])   . Depression   . GERD (gastroesophageal reflux disease)   . Headache    otc med prn  . Kidney stone    passed  . Ovarian cyst rupture   . SVD (spontaneous vaginal delivery)    x 1  . UTI (lower urinary tract infection)    Past Surgical History:  Procedure Laterality Date  . APPENDECTOMY    . CRYOTHERAPY    . DIAGNOSTIC LAPAROSCOPY    . LAPAROSCOPIC LYSIS OF ADHESIONS N/A 07/29/2014   Procedure: LAPAROSCOPIC LYSIS OF ADHESIONS;  Surgeon: Sanjuana Kava, MD;  Location: [facility];  Service: Gynecology;  Laterality: N/A;  . LAPAROSCOPY N/A 07/29/2014   Procedure: LAPAROSCOPY OPERATIVE;  Surgeon:  Sanjuana Kava, MD;  Location: [facility];  Service: Gynecology;  Laterality: N/A;  . WISDOM TOOTH EXTRACTION     History reviewed. No pertinent family history. Social History  Substance Use Topics  . Smoking status: Current Every Day Smoker    Packs/day: 0.10    Years: 10.00    Types: Cigarettes  . Smokeless tobacco: Never Used  . Alcohol use Yes     Comment: occasional   OB History    No data available     Review of Systems  Constitutional: Negative for chills and fever.  HENT: Positive for dental problem. Negative for congestion, drooling, facial swelling and trouble swallowing.   Eyes: Negative.   Respiratory: Negative.   Cardiovascular: Negative.   Gastrointestinal: Negative.   Musculoskeletal: Negative for neck pain and neck stiffness.  Skin: Negative.   Neurological: Negative.     Allergies  Clindamycin/lincomycin  Home Medications   Prior to Admission medications   Medication Sig Start Date End Date Taking? Authorizing Provider  HYDROcodone-acetaminophen (NORCO/VICODIN) 5-325 MG tablet Take 1 tablet by mouth every 6 (six) hours as needed. 07/14/16   Barnet Glasgow, NP  ondansetron (ZOFRAN ODT) 4 MG disintegrating tablet Take 1 tablet (4 mg total) by mouth every 8 (eight) hours as needed for nausea or vomiting. 07/14/16   Barnet Glasgow, NP  penicillin v potassium (VEETID) 500 MG tablet Take 1 tablet (500 mg total) by mouth 4 (four) times daily. 07/14/16 07/21/16  Barnet Glasgow, NP   Meds Ordered  and Administered this Visit  Medications - No data to display  BP 122/60 (BP Location: Right Arm)   Pulse 64   Temp 98 F (36.7 C) (Oral)   Resp 16   SpO2 100%  No data found.   Physical Exam  Constitutional: She is oriented to person, place, and time. She appears well-developed and well-nourished. No distress.  HENT:  Head: Normocephalic and atraumatic.  Right Ear: External ear normal.  Left Ear: External ear normal.  Mouth/Throat: Uvula is midline, oropharynx is  clear and moist and mucous membranes are normal. Dental abscesses present. Tonsils are 0 on the right. Tonsils are 0 on the left. No tonsillar exudate.    Neurological: She is alert and oriented to person, place, and time.  Skin: Skin is warm and dry. Capillary refill takes less than 2 seconds. No rash noted. She is not diaphoretic. No erythema.  Psychiatric: She has a normal mood and affect. Her behavior is normal.  Nursing note and vitals reviewed.   Urgent Care Course     Procedures (including critical care time)  Labs Review Labs Reviewed - No data to display  Imaging Review No results found.      MDM   1. Dental abscess    Treating for dental abscess, patient has allergy to clarithromycin, given prescription for penicillin, hydrocodone for pain. Counseling provided on follow-up guidelines, strongly encouraged to contact her dentist as soon as possible.  [place] controlled substances reporting system consulted prior to issuing prescription, no prescriptions for any controlled substances issued in the last 12 months    Barnet Glasgow, NP 07/14/16 2045

## 2016-07-14 NOTE — ED Triage Notes (Signed)
The patient presented to the [facility] with a complaint of a possible dental abscess x 2 days. The patient stated that her dentist told her to come to the [facility].

## 2017-06-01 ENCOUNTER — Ambulatory Visit (HOSPITAL_COMMUNITY)
Admission: EM | Admit: 2017-06-01 | Discharge: 2017-06-01 | Disposition: A | Discharge status: Home / Self Care | Payer: Self-pay | Attending: Emergency Medicine | Admitting: Emergency Medicine

## 2017-06-01 ENCOUNTER — Encounter (HOSPITAL_COMMUNITY): Payer: Self-pay | Admitting: Emergency Medicine

## 2017-06-01 DIAGNOSIS — L0291 Cutaneous abscess, unspecified: Secondary | ICD-10-CM

## 2017-06-01 DIAGNOSIS — L03116 Cellulitis of left lower limb: Secondary | ICD-10-CM

## 2017-06-01 MED ORDER — DOXYCYCLINE HYCLATE 100 MG PO CAPS: 100.0000 mg | ORAL_CAPSULE | Freq: Two times a day (BID) | ORAL | 0 refills | Status: AC

## 2017-06-01 NOTE — ED Triage Notes (Signed)
Pt c/o boil underneath breast as well as one on her L upper leg. L upper leg abscess is red and swollen with spreading. Pt states she marked a line around the redness and it has moved passed the line.

## 2017-06-01 NOTE — Discharge Instructions (Signed)
Please begin doxycycline twice daily for the next 10 days.  Please apply warm compresses multiple times a day, warm soaks in the bathtub with massage to express any drainage.  Please return if symptoms not improving with treatment or if symptoms are changing or worsening.

## 2017-06-01 NOTE — ED Provider Notes (Signed)
[facility]    CSN: [id number] Arrival date & time: 06/01/17  1254     History   Chief Complaint Chief Complaint  Patient presents with  . Abscess  . Wound Infection    HPI Ashley Francis is a 32 y.o. female presenting today with concern for abscess.  States that over the past 2-3 days she has had increasing redness, swelling and pain to her left lower extremity near her groin.  She has had frequent abscesses in different spots.  Previously had one to her right breast that is healing and almost resolved.  She is not on any antibiotics for that abscess.  She is requesting I&D today.  HPI  Past Medical History:  Diagnosis Date  . Anxiety   . Bronchitis    dx 06/2014 - tx with albuterol inhaler  . Cervical cancer ([place])   . Depression   . GERD (gastroesophageal reflux disease)   . Headache    otc med prn  . Kidney stone    passed  . Ovarian cyst rupture   . SVD (spontaneous vaginal delivery)    x 1  . UTI (lower urinary tract infection)     There are no active problems to display for this patient.   Past Surgical History:  Procedure Laterality Date  . APPENDECTOMY    . CRYOTHERAPY    . DIAGNOSTIC LAPAROSCOPY    . LAPAROSCOPIC LYSIS OF ADHESIONS N/A 07/29/2014   Procedure: LAPAROSCOPIC LYSIS OF ADHESIONS;  Surgeon: Sanjuana Kava, MD;  Location: [facility];  Service: Gynecology;  Laterality: N/A;  . LAPAROSCOPY N/A 07/29/2014   Procedure: LAPAROSCOPY OPERATIVE;  Surgeon: Sanjuana Kava, MD;  Location: [facility];  Service: Gynecology;  Laterality: N/A;  . WISDOM TOOTH EXTRACTION      OB History    No data available       Home Medications    Prior to Admission medications   Medication Sig Start Date End Date Taking? Authorizing Provider  doxycycline (VIBRAMYCIN) 100 MG capsule Take 1 capsule (100 mg total) by mouth 2 (two) times daily for 10 days. 06/01/17 06/11/17  Wieters, Hallie C, PA-C  HYDROcodone-acetaminophen (NORCO/VICODIN) 5-325 MG tablet Take 1 tablet  by mouth every 6 (six) hours as needed. Patient not taking: Reported on 06/01/2017 07/14/16   Barnet Glasgow, NP  ondansetron (ZOFRAN ODT) 4 MG disintegrating tablet Take 1 tablet (4 mg total) by mouth every 8 (eight) hours as needed for nausea or vomiting. Patient not taking: Reported on 06/01/2017 07/14/16   Barnet Glasgow, NP  diphenhydrAMINE (BENADRYL) 25 MG tablet Take 0.5 tablets (12.5 mg total) by mouth every 6 (six) hours as needed for itching (Take half tablet every 4-6 hours for itching.-This can cause some sedation, exercise caution with machinery and activity). 01/06/13 03/20/13  Nita Sells, MD    Family History No family history on file.  Social History Social History   Tobacco Use  . Smoking status: Current Every Day Smoker    Packs/day: 0.10    Years: 10.00    Pack years: 1.00    Types: Cigarettes  . Smokeless tobacco: Never Used  Substance Use Topics  . Alcohol use: Yes    Comment: occasional  . Drug use: No     Allergies   Clindamycin/lincomycin   Review of Systems Review of Systems  Constitutional: Negative for fatigue and fever.  Respiratory: Negative for shortness of breath.   Cardiovascular: Negative for chest pain.  Gastrointestinal: Negative for abdominal pain, nausea and  vomiting.  Genitourinary: Negative for dysuria, pelvic pain and vaginal discharge.  Skin: Positive for color change.  Neurological: Negative for dizziness, light-headedness and numbness.     Physical Exam Triage Vital Signs ED Triage Vitals  Enc Vitals Group     BP 06/01/17 1438 130/83     Pulse Rate 06/01/17 1438 71     Resp 06/01/17 1438 16     Temp 06/01/17 1438 98.4 F (36.9 C)     Temp src --      SpO2 06/01/17 1438 100 %     Weight --      Height --      Head Circumference --      Peak Flow --      Pain Score 06/01/17 1440 7     Pain Loc --      Pain Edu? --      Excl. in [place]? --    No data found.  Updated Vital Signs BP 130/83   Pulse 71    Temp 98.4 F (36.9 C)   Resp 16   LMP 05/04/2017   SpO2 100%   Visual Acuity Right Eye Distance:   Left Eye Distance:   Bilateral Distance:    Right Eye Near:   Left Eye Near:    Bilateral Near:     Physical Exam  Constitutional: She appears well-developed and well-nourished. No distress.  HENT:  Head: Normocephalic and atraumatic.  Eyes: Conjunctivae are normal.  Neck: Neck supple.  Cardiovascular: Normal rate and regular rhythm.  No murmur heard. Pulmonary/Chest: Effort normal and breath sounds normal. No respiratory distress.  Abdominal: Soft. There is no tenderness.  Musculoskeletal: She exhibits no edema.  Neurological: She is alert.  Skin: Skin is warm and dry.  2 erythematous lesions with underlying induration to upper aspect of left leg near left groin.  Surrounding erythematous extending to approximately one third of thigh.  No involvement of groin, no palpable lymph nodes.  Psychiatric: She has a normal mood and affect.  Nursing note and vitals reviewed.    UC Treatments / Results  Labs (all labs ordered are listed, but only abnormal results are displayed) Labs Reviewed - No data to display  EKG  EKG Interpretation None       Radiology No results found.  Procedures Incision and Drainage Date/Time: 06/01/2017 3:27 PM Performed by: Wieters, [place] C, PA-C Authorized by: Wieters, Elesa Hacker, PA-C   Consent:    Consent obtained:  Verbal   Consent given by:  Patient   Risks discussed:  Bleeding and incomplete drainage   Alternatives discussed:  No treatment and alternative treatment Location:    Type:  Abscess   Size:  1 cm   Location:  Lower extremity   Lower extremity location:  Leg   Leg location:  L upper leg Pre-procedure details:    Skin preparation:  Betadine Anesthesia (see MAR for exact dosages):    Anesthesia method:  Local infiltration   Local anesthetic:  Lidocaine 2% WITH epi Procedure type:    Complexity:  Simple Procedure  details:    Incision types:  Stab incision   Incision depth:  Subcutaneous   Scalpel blade:  11   Wound management:  Probed and deloculated   Drainage:  Serosanguinous   Drainage amount:  Scant   Wound treatment:  Wound left open   Packing materials:  None Post-procedure details:    Patient tolerance of procedure:  Tolerated well, no immediate complications   (including  critical care time)  Medications Ordered in UC Medications - No data to display   Initial Impression / Assessment and Plan / UC Course  I have reviewed the triage vital signs and the nursing notes.  Pertinent labs & imaging results that were available during my care of the patient were reviewed by me and considered in my medical decision making (see chart for details).     Patient with recurrent abscesses, small areas of induration.  Surrounding erythema concerning for cellulitis.  I&D performed with minimal drainage.  Will treat with doxycycline and to cover for staph and strep.  Warm compresses/warm soaks with massage.  Follow-up if symptoms not resolving. Discussed strict return precautions. Patient verbalized understanding and is agreeable with plan.   Final Clinical Impressions(s) / UC Diagnoses   Final diagnoses:  Abscess  Cellulitis of left lower extremity    ED Discharge Orders        Ordered    doxycycline (VIBRAMYCIN) 100 MG capsule  2 times daily     06/01/17 1521       Controlled Substance Prescriptions [place] Controlled Substance Registry consulted? Not Applicable   Janith Lima, [place] 06/01/17 1528

## 2017-09-12 ENCOUNTER — Emergency Department (HOSPITAL_COMMUNITY): Payer: Medicaid Other

## 2017-09-12 ENCOUNTER — Other Ambulatory Visit: Payer: Self-pay

## 2017-09-12 ENCOUNTER — Emergency Department (HOSPITAL_COMMUNITY)
Admission: EM | Admit: 2017-09-12 | Discharge: 2017-09-12 | Disposition: A | Discharge status: Home / Self Care | Payer: Medicaid Other | Attending: Emergency Medicine | Admitting: Emergency Medicine

## 2017-09-12 ENCOUNTER — Encounter (HOSPITAL_COMMUNITY): Payer: Self-pay

## 2017-09-12 DIAGNOSIS — Y999 Unspecified external cause status: Secondary | ICD-10-CM | POA: Diagnosis not present

## 2017-09-12 DIAGNOSIS — H538 Other visual disturbances: Secondary | ICD-10-CM | POA: Insufficient documentation

## 2017-09-12 DIAGNOSIS — Z733 Stress, not elsewhere classified: Secondary | ICD-10-CM | POA: Insufficient documentation

## 2017-09-12 DIAGNOSIS — Y33XXXA Other specified events, undetermined intent, initial encounter: Secondary | ICD-10-CM | POA: Insufficient documentation

## 2017-09-12 DIAGNOSIS — Y939 Activity, unspecified: Secondary | ICD-10-CM | POA: Diagnosis not present

## 2017-09-12 DIAGNOSIS — R0789 Other chest pain: Secondary | ICD-10-CM | POA: Insufficient documentation

## 2017-09-12 DIAGNOSIS — Z8541 Personal history of malignant neoplasm of cervix uteri: Secondary | ICD-10-CM | POA: Diagnosis not present

## 2017-09-12 DIAGNOSIS — E039 Hypothyroidism, unspecified: Secondary | ICD-10-CM | POA: Insufficient documentation

## 2017-09-12 DIAGNOSIS — F17210 Nicotine dependence, cigarettes, uncomplicated: Secondary | ICD-10-CM | POA: Insufficient documentation

## 2017-09-12 DIAGNOSIS — R42 Dizziness and giddiness: Secondary | ICD-10-CM | POA: Diagnosis not present

## 2017-09-12 DIAGNOSIS — F419 Anxiety disorder, unspecified: Secondary | ICD-10-CM | POA: Insufficient documentation

## 2017-09-12 DIAGNOSIS — S29019A Strain of muscle and tendon of unspecified wall of thorax, initial encounter: Secondary | ICD-10-CM

## 2017-09-12 DIAGNOSIS — Y929 Unspecified place or not applicable: Secondary | ICD-10-CM | POA: Diagnosis not present

## 2017-09-12 DIAGNOSIS — S299XXA Unspecified injury of thorax, initial encounter: Secondary | ICD-10-CM | POA: Diagnosis present

## 2017-09-12 LAB — CBC
HEMATOCRIT: 42.9 % (ref 36.0–46.0)
HEMOGLOBIN: 14.6 g/dL (ref 12.0–15.0)
MCH: 31.1 pg (ref 26.0–34.0)
MCHC: 34 g/dL (ref 30.0–36.0)
MCV: 91.3 fL (ref 78.0–100.0)
Platelets: 327 10*3/uL (ref 150–400)
RBC: 4.7 MIL/uL (ref 3.87–5.11)
RDW: 12 % (ref 11.5–15.5)
WBC: 7.9 10*3/uL (ref 4.0–10.5)

## 2017-09-12 LAB — BASIC METABOLIC PANEL
Anion gap: 10 (ref 5–15)
BUN: 14 mg/dL (ref 6–20)
CALCIUM: 8.8 mg/dL — AB (ref 8.9–10.3)
CO2: 23 mmol/L (ref 22–32)
Chloride: 106 mmol/L (ref 98–111)
Creatinine, Ser: 0.77 mg/dL (ref 0.44–1.00)
Glucose, Bld: 108 mg/dL — ABNORMAL HIGH (ref 70–99)
Potassium: 4.7 mmol/L (ref 3.5–5.1)
Sodium: 139 mmol/L (ref 135–145)

## 2017-09-12 LAB — URINALYSIS, ROUTINE W REFLEX MICROSCOPIC
Bacteria, UA: NONE SEEN
Bilirubin Urine: NEGATIVE
GLUCOSE, UA: NEGATIVE mg/dL
Ketones, ur: NEGATIVE mg/dL
Leukocytes, UA: NEGATIVE
Nitrite: NEGATIVE
Protein, ur: NEGATIVE mg/dL
Specific Gravity, Urine: 1.021 (ref 1.005–1.030)
pH: 5 (ref 5.0–8.0)

## 2017-09-12 LAB — D-DIMER, QUANTITATIVE (NOT AT ARMC): D DIMER QUANT: 0.46 ug{FEU}/mL (ref 0.00–0.50)

## 2017-09-12 LAB — I-STAT TROPONIN, ED
TROPONIN I, POC: 0 ng/mL (ref 0.00–0.08)
TROPONIN I, POC: 0 ng/mL (ref 0.00–0.08)

## 2017-09-12 LAB — HEPATIC FUNCTION PANEL
ALT: 33 U/L (ref 0–44)
AST: 23 U/L (ref 15–41)
Albumin: 3.8 g/dL (ref 3.5–5.0)
Alkaline Phosphatase: 62 U/L (ref 38–126)
BILIRUBIN INDIRECT: 0.3 mg/dL (ref 0.3–0.9)
Bilirubin, Direct: 0.1 mg/dL (ref 0.0–0.2)
Total Bilirubin: 0.4 mg/dL (ref 0.3–1.2)
Total Protein: 7.1 g/dL (ref 6.5–8.1)

## 2017-09-12 LAB — LIPASE, BLOOD: Lipase: 32 U/L (ref 11–51)

## 2017-09-12 LAB — I-STAT BETA HCG BLOOD, ED (MC, WL, AP ONLY)

## 2017-09-12 LAB — T4, FREE: Free T4: 0.7 ng/dL — ABNORMAL LOW (ref 0.82–1.77)

## 2017-09-12 LAB — BRAIN NATRIURETIC PEPTIDE: B Natriuretic Peptide: 37.3 pg/mL (ref 0.0–100.0)

## 2017-09-12 LAB — TSH: TSH: 1.89 u[IU]/mL (ref 0.350–4.500)

## 2017-09-12 MED ORDER — LEVOTHYROXINE SODIUM 25 MCG PO TABS: 25.0000 ug | ORAL_TABLET | Freq: Every day | ORAL | 0 refills | Status: AC

## 2017-09-12 MED ORDER — METHOCARBAMOL 500 MG PO TABS
1000.0000 mg | ORAL_TABLET | Freq: Once | ORAL | Status: AC
  Administered 2017-09-12: 1000 mg via ORAL
  Filled 2017-09-12: qty 2

## 2017-09-12 MED ORDER — KETOROLAC TROMETHAMINE 60 MG/2ML IM SOLN
60.0000 mg | Freq: Once | INTRAMUSCULAR | Status: AC
Start: 2017-09-12 — End: 2017-09-12
  Administered 2017-09-12: 60 mg via INTRAMUSCULAR
  Filled 2017-09-12: qty 2

## 2017-09-12 MED ORDER — IBUPROFEN 600 MG PO TABS
600.0000 mg | ORAL_TABLET | Freq: Four times a day (QID) | ORAL | 0 refills | Status: AC | PRN
Start: 2017-09-12 — End: ?

## 2017-09-12 MED ORDER — METHOCARBAMOL 500 MG PO TABS: 1000.0000 mg | ORAL_TABLET | Freq: Three times a day (TID) | ORAL | 0 refills | Status: AC | PRN

## 2017-09-12 MED ORDER — KETOROLAC TROMETHAMINE 30 MG/ML IJ SOLN: 30.0000 mg | Freq: Once | INTRAMUSCULAR | Status: DC

## 2017-09-12 NOTE — ED Triage Notes (Signed)
Pt endorses pain between the shoulder blades radiating to the chest, bilateral lower extemity swelling and dizziness x 3 days. Pt has hx of anxiety and under a lot of stress now. VSS

## 2017-09-12 NOTE — Discharge Instructions (Signed)
Follow-up closely with your primary physician to recheck thyroid levels and adjust medication as needed.  Make sure you are drinking plenty of fluids and change positions slowly.

## 2017-09-12 NOTE — ED Notes (Signed)
Pt asking about wait times. Pt updated.

## 2017-09-12 NOTE — ED Notes (Signed)
Lab tests reviewed while pt waits for a room in the lobby.

## 2017-09-12 NOTE — ED Provider Notes (Signed)
[facility] EMERGENCY DEPARTMENT Provider Note   CSN: [id number] Arrival date & time: 09/12/17  1138     History   Chief Complaint Chief Complaint  Patient presents with  . Chest Pain  . Joint Swelling    HPI Ashley Francis is a 32 y.o. female.  HPI Patient presents with 2 to 3 days of sharp left-sided chest pain which is episodic.  These pains radiate to the left thoracic back.  States the pain in her back is been constant for the last 2 days.  Denies any known trauma or heavy lifting.  She has had bilateral lower extremity swelling which has improved.  Also complains of dizziness which she describes as being off balance, eyes moving back and forth and feels like she is going to pass out.  She denies any recent extended travel or immobilization.  Has history of thromboembolic disease on mother side and possibly father died of PE recently.  Patient states she does get some tingling in her left fingers with episodes of dizziness.  Currently denies any focal weakness or numbness.  She complains of occasional abdominal cramping and has had some loose stools today.  Denies any blood in the stool. Past Medical History:  Diagnosis Date  . Anxiety   . Bronchitis    dx 06/2014 - tx with albuterol inhaler  . Cervical cancer ([place])   . Depression   . GERD (gastroesophageal reflux disease)   . Headache    otc med prn  . Kidney stone    passed  . Ovarian cyst rupture   . SVD (spontaneous vaginal delivery)    x 1  . UTI (lower urinary tract infection)     There are no active problems to display for this patient.   Past Surgical History:  Procedure Laterality Date  . APPENDECTOMY    . CRYOTHERAPY    . DIAGNOSTIC LAPAROSCOPY    . LAPAROSCOPIC LYSIS OF ADHESIONS N/A 07/29/2014   Procedure: LAPAROSCOPIC LYSIS OF ADHESIONS;  Surgeon: Sanjuana Kava, MD;  Location: [facility];  Service: Gynecology;  Laterality: N/A;  . LAPAROSCOPY N/A 07/29/2014   Procedure: LAPAROSCOPY  OPERATIVE;  Surgeon: Sanjuana Kava, MD;  Location: [facility];  Service: Gynecology;  Laterality: N/A;  . WISDOM TOOTH EXTRACTION       OB History   None      Home Medications    Prior to Admission medications   Medication Sig Start Date End Date Taking? Authorizing Provider  HYDROcodone-acetaminophen (NORCO/VICODIN) 5-325 MG tablet Take 1 tablet by mouth every 6 (six) hours as needed. Patient not taking: Reported on 06/01/2017 07/14/16   Barnet Glasgow, NP  ibuprofen (ADVIL,MOTRIN) 600 MG tablet Take 1 tablet (600 mg total) by mouth every 6 (six) hours as needed. 09/12/17   Julianne Rice, MD  levothyroxine (SYNTHROID, LEVOTHROID) 25 MCG tablet Take 1 tablet (25 mcg total) by mouth daily before breakfast. 09/12/17   Julianne Rice, MD  methocarbamol (ROBAXIN) 500 MG tablet Take 2 tablets (1,000 mg total) by mouth every 8 (eight) hours as needed for muscle spasms. 09/12/17   Julianne Rice, MD  ondansetron (ZOFRAN ODT) 4 MG disintegrating tablet Take 1 tablet (4 mg total) by mouth every 8 (eight) hours as needed for nausea or vomiting. Patient not taking: Reported on 06/01/2017 07/14/16   Barnet Glasgow, NP    Family History History reviewed. No pertinent family history.  Social History Social History   Tobacco Use  . Smoking status: Current Every Day  Smoker    Packs/day: 0.10    Years: 10.00    Pack years: 1.00    Types: Cigarettes  . Smokeless tobacco: Never Used  Substance Use Topics  . Alcohol use: Yes    Comment: occasional  . Drug use: No     Allergies   Clindamycin/lincomycin   Review of Systems Review of Systems  Constitutional: Positive for fatigue. Negative for chills and fever.  HENT: Negative for sinus pressure, sore throat and trouble swallowing.   Eyes: Positive for visual disturbance.  Respiratory: Positive for shortness of breath. Negative for cough.   Cardiovascular: Positive for chest pain and leg swelling. Negative for palpitations.    Gastrointestinal: Positive for abdominal pain, diarrhea and nausea. Negative for abdominal distention, constipation and vomiting.  Genitourinary: Positive for vaginal bleeding. Negative for dysuria, flank pain and frequency.  Musculoskeletal: Positive for back pain and myalgias. Negative for neck pain and neck stiffness.  Skin: Negative for rash and wound.  Neurological: Positive for dizziness, light-headedness and numbness. Negative for syncope, speech difficulty, weakness and headaches.  Psychiatric/Behavioral: The patient is nervous/anxious.   All other systems reviewed and are negative.    Physical Exam Updated Vital Signs BP 110/70   Pulse (!) 50   Temp 98.2 F (36.8 C) (Oral)   Resp (!) 21   Ht 5\' 4"  (1.626 m)   Wt 99.8 kg (220 lb)   SpO2 98%   BMI 37.76 kg/m   Physical Exam  Constitutional: She is oriented to person, place, and time. She appears well-developed and well-nourished. No distress.  HENT:  Head: Normocephalic and atraumatic.  Mouth/Throat: Oropharynx is clear and moist. No oropharyngeal exudate.  Eyes: Pupils are equal, round, and reactive to light. EOM are normal.  No nystagmus  Neck: Normal range of motion. Neck supple. No JVD present.  Cardiovascular: Normal rate and regular rhythm. Exam reveals no gallop and no friction rub.  No murmur heard. Pulmonary/Chest: Effort normal and breath sounds normal. No stridor. No respiratory distress. She has no wheezes. She has no rales. She exhibits no tenderness.  Abdominal: Soft. Bowel sounds are normal. There is no tenderness. There is no rebound and no guarding.  Musculoskeletal: Normal range of motion. She exhibits tenderness. She exhibits no edema.  Patient with left-sided thoracic muscular tenderness to palpation especially on the medial surface of the left scapula.  No midline thoracic or lumbar tenderness.  No lower extremity swelling, asymmetry or tenderness.  Distal pulses intact in all extremities.   Lymphadenopathy:    She has no cervical adenopathy.  Neurological: She is alert and oriented to person, place, and time.  Patient is alert and oriented x3 with clear, goal oriented speech. Patient has 5/5 motor in all extremities. Sensation is intact to light touch. Bilateral finger-to-nose is normal with no signs of dysmetria.  Skin: Skin is warm and dry. Capillary refill takes less than 2 seconds. No rash noted. She is not diaphoretic. No erythema.  Psychiatric: Her behavior is normal.  Mildly anxious appearing  Nursing note and vitals reviewed.    ED Treatments / Results  Labs (all labs ordered are listed, but only abnormal results are displayed) Labs Reviewed  BASIC METABOLIC PANEL - Abnormal; Notable for the following components:      Result Value   Glucose, Bld 108 (*)    Calcium 8.8 (*)    All other components within normal limits  URINALYSIS, ROUTINE W REFLEX MICROSCOPIC - Abnormal; Notable for the following components:  Hgb urine dipstick MODERATE (*)    All other components within normal limits  T4, FREE - Abnormal; Notable for the following components:   Free T4 0.70 (*)    All other components within normal limits  CBC  HEPATIC FUNCTION PANEL  LIPASE, BLOOD  D-DIMER, QUANTITATIVE (NOT AT [facility])  BRAIN NATRIURETIC PEPTIDE  TSH  I-STAT TROPONIN, ED  I-STAT BETA HCG BLOOD, ED ([facility], [facility], [facility] ONLY)  I-STAT TROPONIN, ED    EKG EKG Interpretation  Date/Time:  Friday September 12 2017 11:41:42 EDT Ventricular Rate:  98 PR Interval:  144 QRS Duration: 76 QT Interval:  348 QTC Calculation: 444 R Axis:   87 Text Interpretation:  Normal sinus rhythm Normal ECG Confirmed by Julianne Rice [phone]) on 09/12/2017 3:20:20 PM   Radiology Dg Chest 2 View  Result Date: 09/12/2017 CLINICAL DATA:  Acute chest pain for 2 days. EXAM: CHEST - 2 VIEW COMPARISON:  04/09/2012 chest radiograph FINDINGS: The cardiomediastinal silhouette is unremarkable. There is no evidence of focal  airspace disease, pulmonary edema, suspicious pulmonary nodule/mass, pleural effusion, or pneumothorax. No acute bony abnormalities are identified. IMPRESSION: No active cardiopulmonary disease. Electronically Signed   By: Margarette Canada M.D.   On: 09/12/2017 12:18    Procedures Procedures (including critical care time)  Medications Ordered in ED Medications  ketorolac (TORADOL) injection 60 mg (has no administration in time range)  methocarbamol (ROBAXIN) tablet 1,000 mg (1,000 mg Oral Given 09/12/17 1836)     Initial Impression / Assessment and Plan / ED Course  I have reviewed the triage vital signs and the nursing notes.  Pertinent labs & imaging results that were available during my care of the patient were reviewed by me and considered in my medical decision making (see chart for details).     Troponin and d-dimer are negative.  EKG without evidence of ischemia.  Low suspicion for CAD.  Patient does have low free T4 levels.  Some of her symptoms may be related to hyperthyroidism.  Will place on low-dose levothyroxine.  Patient is establishing care with primary physician and states she will follow-up closely.  Her back pain is reproduced with palpation over her thoracic musculature.  Pain is likely related to muscle strain and stress.  Will treat with NSAIDs and muscle relaxant.  Strict return precautions have been given.  Final Clinical Impressions(s) / ED Diagnoses   Final diagnoses:  Atypical chest pain  Thoracic myofascial strain, initial encounter  Hypothyroidism, unspecified type    ED Discharge Orders        Ordered    ibuprofen (ADVIL,MOTRIN) 600 MG tablet  Every 6 hours PRN     09/12/17 1855    methocarbamol (ROBAXIN) 500 MG tablet  Every 8 hours PRN     09/12/17 1855    levothyroxine (SYNTHROID, LEVOTHROID) 25 MCG tablet  Daily before breakfast     09/12/17 1855       Julianne Rice, MD 09/12/17 1857

## 2019-07-29 ENCOUNTER — Institutional Professional Consult (permissible substitution): Payer: Medicaid Other | Admitting: Plastic Surgery

## 2019-08-02 ENCOUNTER — Emergency Department (HOSPITAL_COMMUNITY)
Admission: EM | Admit: 2019-08-02 | Discharge: 2019-08-02 | Disposition: A | Discharge status: Home / Self Care | Payer: Medicaid Other | Attending: Emergency Medicine | Admitting: Emergency Medicine

## 2019-08-02 ENCOUNTER — Emergency Department (HOSPITAL_COMMUNITY): Payer: Medicaid Other

## 2019-08-02 ENCOUNTER — Encounter (HOSPITAL_COMMUNITY): Payer: Self-pay | Admitting: Pediatrics

## 2019-08-02 ENCOUNTER — Other Ambulatory Visit: Payer: Self-pay

## 2019-08-02 DIAGNOSIS — F17210 Nicotine dependence, cigarettes, uncomplicated: Secondary | ICD-10-CM | POA: Insufficient documentation

## 2019-08-02 DIAGNOSIS — Z79899 Other long term (current) drug therapy: Secondary | ICD-10-CM | POA: Insufficient documentation

## 2019-08-02 DIAGNOSIS — R519 Headache, unspecified: Secondary | ICD-10-CM | POA: Insufficient documentation

## 2019-08-02 LAB — COMPREHENSIVE METABOLIC PANEL
ALT: 28 U/L (ref 0–44)
AST: 20 U/L (ref 15–41)
Albumin: 4.1 g/dL (ref 3.5–5.0)
Alkaline Phosphatase: 61 U/L (ref 38–126)
Anion gap: 10 (ref 5–15)
BUN: 10 mg/dL (ref 6–20)
CO2: 27 mmol/L (ref 22–32)
Calcium: 9.4 mg/dL (ref 8.9–10.3)
Chloride: 102 mmol/L (ref 98–111)
Creatinine, Ser: 0.89 mg/dL (ref 0.44–1.00)
GFR calc Af Amer: >60 mL/min (ref >60)
GFR calc non Af Amer: >60 mL/min (ref >60)
Glucose, Bld: 96 mg/dL (ref 70–99)
Potassium: 4.8 mmol/L (ref 3.5–5.1)
Sodium: 139 mmol/L (ref 135–145)
Total Bilirubin: 0.6 mg/dL (ref 0.3–1.2)
Total Protein: 7.4 g/dL (ref 6.5–8.1)

## 2019-08-02 LAB — CBC WITH DIFFERENTIAL/PLATELET
Abs Immature Granulocytes: 0.02 10*3/uL (ref 0.00–0.07)
Basophils Absolute: 0 10*3/uL (ref 0.0–0.1)
Basophils Relative: 0 %
Eosinophils Absolute: 0.2 10*3/uL (ref 0.0–0.5)
Eosinophils Relative: 2 %
HCT: 44.4 % (ref 36.0–46.0)
Hemoglobin: 15.1 g/dL — ABNORMAL HIGH (ref 12.0–15.0)
Immature Granulocytes: 0 %
Lymphocytes Relative: 27 %
Lymphs Abs: 2.8 10*3/uL (ref 0.7–4.0)
MCH: 31.8 pg (ref 26.0–34.0)
MCHC: 34 g/dL (ref 30.0–36.0)
MCV: 93.5 fL (ref 80.0–100.0)
Monocytes Absolute: 0.7 10*3/uL (ref 0.1–1.0)
Monocytes Relative: 7 %
Neutro Abs: 6.4 10*3/uL (ref 1.7–7.7)
Neutrophils Relative %: 64 %
Platelets: 323 10*3/uL (ref 150–400)
RBC: 4.75 MIL/uL (ref 3.87–5.11)
RDW: 12.3 % (ref 11.5–15.5)
WBC: 10.1 10*3/uL (ref 4.0–10.5)
nRBC: 0 % (ref 0.0–0.2)

## 2019-08-02 LAB — I-STAT BETA HCG BLOOD, ED (MC, WL, AP ONLY): I-stat hCG, quantitative: <5 m[IU]/mL (ref <5)

## 2019-08-02 MED ORDER — KETOROLAC TROMETHAMINE 15 MG/ML IJ SOLN: 15.0000 mg | Freq: Once | INTRAMUSCULAR | Status: DC

## 2019-08-02 MED ORDER — PROCHLORPERAZINE EDISYLATE 10 MG/2ML IJ SOLN
10.0000 mg | Freq: Once | INTRAMUSCULAR | Status: AC
  Administered 2019-08-02: 10 mg via INTRAMUSCULAR
  Filled 2019-08-02: qty 2

## 2019-08-02 MED ORDER — KETOROLAC TROMETHAMINE 15 MG/ML IJ SOLN
15.0000 mg | Freq: Once | INTRAMUSCULAR | Status: AC
  Administered 2019-08-02: 15 mg via INTRAMUSCULAR
  Filled 2019-08-02: qty 1

## 2019-08-02 MED ORDER — DIPHENHYDRAMINE HCL 50 MG/ML IJ SOLN
25.0000 mg | Freq: Once | INTRAMUSCULAR | Status: AC
  Administered 2019-08-02: 25 mg via INTRAMUSCULAR
  Filled 2019-08-02: qty 1

## 2019-08-02 MED ORDER — PROCHLORPERAZINE EDISYLATE 10 MG/2ML IJ SOLN: 10.0000 mg | Freq: Once | INTRAMUSCULAR | Status: DC

## 2019-08-02 MED ORDER — DIPHENHYDRAMINE HCL 50 MG/ML IJ SOLN: 12.5000 mg | Freq: Once | INTRAMUSCULAR | Status: DC

## 2019-08-02 NOTE — Discharge Instructions (Addendum)
As discussed, your labs and CT of your head were all normal.  I have placed a referral to neurology for further evaluation of your headaches.  Expect a phone call this week to schedule an appointment.  You may take over-the-counter ibuprofen or Tylenol as needed for pain.  Return to the ER for new or worsening symptoms.

## 2019-08-02 NOTE — ED Triage Notes (Signed)
Pt reported multiple  episodes of severe headache that left an "ache". Stated last night had some "crazy sweats" and felt heart rate was up. Denies significant medical hx other than anxiety.

## 2019-08-02 NOTE — ED Provider Notes (Signed)
[facility] EMERGENCY DEPARTMENT Provider Note   CSN: [id number] Arrival date & time: 08/02/19  1754     History Chief Complaint  Patient presents with  . Headache    Ashley Francis is a 34 y.o. female with a past medical history significant for headaches, depression, GERD, anxiety, and cervical cancer who presents to the ED due to 3 episodes of a posterior headache.  Patient notes she has had a severe posterior headache on Saturday, Sunday, and today.  Headaches last anywhere between a minute to 5 minutes.  She describes it as pressure and feels like something "crawling under her scalp".  Headache associated with nausea.  Unsure if she has visual changes during headache because patient notes she closes her eyes during each headache.  She rates her pain an 8/10.  Notes she has a history of headaches, but states this feels different than her normal headache.  Denies fever and chills. She has tried OTC head congestion medication with no relief. No aggravating or alleviating factors.  Denies speech changes, unilateral weakness, numbness/tingling.  Denies head injury.  History obtained from patient and past medical records. No interpreter used during encounter.      Past Medical History:  Diagnosis Date  . Anxiety   . Bronchitis    dx 06/2014 - tx with albuterol inhaler  . Cervical cancer ([place])   . Depression   . GERD (gastroesophageal reflux disease)   . Headache    otc med prn  . Kidney stone    passed  . Ovarian cyst rupture   . SVD (spontaneous vaginal delivery)    x 1  . UTI (lower urinary tract infection)     There are no problems to display for this patient.   Past Surgical History:  Procedure Laterality Date  . APPENDECTOMY    . CRYOTHERAPY    . DIAGNOSTIC LAPAROSCOPY    . LAPAROSCOPIC LYSIS OF ADHESIONS N/A 07/29/2014   Procedure: LAPAROSCOPIC LYSIS OF ADHESIONS;  Surgeon: Sanjuana Kava, MD;  Location: [facility];  Service: Gynecology;  Laterality:  N/A;  . LAPAROSCOPY N/A 07/29/2014   Procedure: LAPAROSCOPY OPERATIVE;  Surgeon: Sanjuana Kava, MD;  Location: [facility];  Service: Gynecology;  Laterality: N/A;  . WISDOM TOOTH EXTRACTION       OB History   No obstetric history on file.     No family history on file.  Social History   Tobacco Use  . Smoking status: Current Every Day Smoker    Packs/day: 0.10    Years: 10.00    Pack years: 1.00    Types: Cigarettes  . Smokeless tobacco: Never Used  Substance Use Topics  . Alcohol use: Yes    Comment: occasional  . Drug use: No    Home Medications Prior to Admission medications   Medication Sig Start Date End Date Taking? Authorizing Provider  HYDROcodone-acetaminophen (NORCO/VICODIN) 5-325 MG tablet Take 1 tablet by mouth every 6 (six) hours as needed. Patient not taking: Reported on 06/01/2017 07/14/16   Barnet Glasgow, NP  ibuprofen (ADVIL,MOTRIN) 600 MG tablet Take 1 tablet (600 mg total) by mouth every 6 (six) hours as needed. 09/12/17   Julianne Rice, MD  levothyroxine (SYNTHROID, LEVOTHROID) 25 MCG tablet Take 1 tablet (25 mcg total) by mouth daily before breakfast. 09/12/17   Julianne Rice, MD  methocarbamol (ROBAXIN) 500 MG tablet Take 2 tablets (1,000 mg total) by mouth every 8 (eight) hours as needed for muscle spasms. 09/12/17   Julianne Rice,  MD  ondansetron (ZOFRAN ODT) 4 MG disintegrating tablet Take 1 tablet (4 mg total) by mouth every 8 (eight) hours as needed for nausea or vomiting. Patient not taking: Reported on 06/01/2017 07/14/16   Barnet Glasgow, NP    Allergies    Clindamycin/lincomycin  Review of Systems   Review of Systems  Constitutional: Negative for chills and fever.  Gastrointestinal: Positive for nausea. Negative for abdominal pain, diarrhea and vomiting.  Neurological: Positive for headaches. Negative for dizziness, facial asymmetry, speech difficulty and weakness.  All other systems reviewed and are negative.   Physical Exam Updated  Vital Signs BP 119/80 (BP Location: Left Arm)   Pulse (!) 52   Temp 98.6 F (37 C) (Oral)   Resp 16   Ht 5\' 4"  (1.626 m)   Wt 98.4 kg   SpO2 99%   BMI 37.25 kg/m   Physical Exam Vitals and nursing note reviewed.  Constitutional:      General: She is not in acute distress.    Appearance: She is not ill-appearing.  HENT:     Head: Normocephalic.  Eyes:     Pupils: Pupils are equal, round, and reactive to light.  Neck:     Comments: No meningismus. Cardiovascular:     Rate and Rhythm: Normal rate and regular rhythm.     Pulses: Normal pulses.     Heart sounds: Normal heart sounds. No murmur. No friction rub. No gallop.   Pulmonary:     Effort: Pulmonary effort is normal.     Breath sounds: Normal breath sounds.  Abdominal:     General: Abdomen is flat. Bowel sounds are normal. There is no distension.     Palpations: Abdomen is soft.     Tenderness: There is no abdominal tenderness. There is no guarding or rebound.  Musculoskeletal:     Cervical back: Neck supple.     Comments: Able to move all 4 extremities without difficulty.   Skin:    General: Skin is warm and dry.  Neurological:     General: No focal deficit present.     Mental Status: She is alert.     Comments: Speech is clear, able to follow commands CN III-XII intact Normal strength in upper and lower extremities bilaterally including dorsiflexion and plantar flexion, strong and equal grip strength Sensation grossly intact throughout Moves extremities without ataxia, coordination intact No pronator drift Ambulates without difficulty  Psychiatric:        Mood and Affect: Mood normal.        Behavior: Behavior normal.     ED Results / Procedures / Treatments   Labs (all labs ordered are listed, but only abnormal results are displayed) Labs Reviewed  CBC WITH DIFFERENTIAL/PLATELET - Abnormal; Notable for the following components:      Result Value   Hemoglobin 15.1 (*)    All other components within  normal limits  COMPREHENSIVE METABOLIC PANEL  I-STAT BETA HCG BLOOD, ED ([facility], [facility], [facility] ONLY)    EKG None  Radiology CT Head Wo Contrast  Result Date: 08/02/2019 CLINICAL DATA:  Severe headache. EXAM: CT HEAD WITHOUT CONTRAST TECHNIQUE: Contiguous axial images were obtained from the base of the skull through the vertex without intravenous contrast. COMPARISON:  Brain CT 01/01/2015 FINDINGS: Brain: Ventricles and sulci are appropriate for patient's age. No evidence for acute cortically based infarct, intracranial hemorrhage, mass lesion or mass-effect. Vascular: No hyperdense vessel or unexpected calcification. Skull: Normal. Negative for fracture or focal lesion. Sinuses/Orbits: Paranasal  sinuses well aerated. Mastoid air cells unremarkable. Other: None. IMPRESSION: No acute intracranial process. Electronically Signed   By: Lovey Newcomer M.D.   On: 08/02/2019 19:50    Procedures Procedures (including critical care time)  Medications Ordered in ED Medications  ketorolac (TORADOL) 15 MG/ML injection 15 mg (has no administration in time range)  prochlorperazine (COMPAZINE) injection 10 mg (has no administration in time range)  diphenhydrAMINE (BENADRYL) injection 12.5 mg (has no administration in time range)    ED Course  I have reviewed the triage vital signs and the nursing notes.  Pertinent labs & imaging results that were available during my care of the patient were reviewed by me and considered in my medical decision making (see chart for details).    MDM Rules/Calculators/A&P                     34 year old female presents to the ED due to 3 episodes of a posterior headache that started on Saturday.  Patient has a history of headaches, but notes this is different than her normal headache.  Stable vitals.  Patient is afebrile, not tachycardic or hypoxic.  Patient in no acute distress and non-ill-appearing.  Physical exam reassuring.  Normal neurological exam.  No meningismus to suggest  meningitis.  Low suspicion for [facility] or other emergent intracranial abnormalities.  Routine labs and CT head ordered at triage.  Will give migraine cocktail for symptomatic relief and reassess patient.  CMP unremarkable with normal renal function and no electrolyte derangements.  CBC reassuring with no leukocytosis.  Negative pregnancy test.  CT head personally reviewed which is negative for any acute abnormalities.  10:07 PM side he notes improvement in symptoms after migraine cocktail.  Will discharge patient with ambulatory neurology referral.  Advised patient to take over-the-counter ibuprofen or Tylenol as needed for pain. Strict ED precautions discussed with patient. Patient states understanding and agrees to plan. Patient discharged home in no acute distress and stable vitals  Final Clinical Impression(s) / ED Diagnoses Final diagnoses:  Acute intractable headache, unspecified headache type    Rx / DC Orders ED Discharge Orders         Ordered    Ambulatory referral to Neurology    Comments: An appointment is requested in approximately: whenever available   08/02/19 2054           Karie Kirks 08/02/19 2207    Dorie Rank, MD 08/03/19 [phone]

## 2019-08-02 NOTE — ED Notes (Signed)
Discharge instructions reviewed with pt. Pt verbalized understanding.   

## 2019-09-08 ENCOUNTER — Institutional Professional Consult (permissible substitution): Payer: Medicaid Other | Admitting: Plastic Surgery
